# Patient Record
Sex: Female | Born: 1985 | Race: White | Hispanic: No | Marital: Married | Smoking: Current every day smoker
Health system: Southern US, Community
[De-identification: ages and names within clinical notes are randomized; demographics above are authoritative.]

## PROBLEM LIST (undated history)

## (undated) DIAGNOSIS — Z5189 Encounter for other specified aftercare: Secondary | ICD-10-CM

## (undated) DIAGNOSIS — L409 Psoriasis, unspecified: Secondary | ICD-10-CM

## (undated) DIAGNOSIS — T7840XA Allergy, unspecified, initial encounter: Secondary | ICD-10-CM

## (undated) DIAGNOSIS — R569 Unspecified convulsions: Secondary | ICD-10-CM

## (undated) DIAGNOSIS — G930 Cerebral cysts: Secondary | ICD-10-CM

## (undated) DIAGNOSIS — R51 Headache: Secondary | ICD-10-CM

## (undated) DIAGNOSIS — I499 Cardiac arrhythmia, unspecified: Secondary | ICD-10-CM

## (undated) HISTORY — DX: Cardiac arrhythmia, unspecified: I49.9

## (undated) HISTORY — DX: Encounter for other specified aftercare: Z51.89

## (undated) HISTORY — DX: Headache: R51

## (undated) HISTORY — DX: Cerebral cysts: G93.0

## (undated) HISTORY — DX: Unspecified convulsions: R56.9

## (undated) HISTORY — DX: Psoriasis, unspecified: L40.9

## (undated) HISTORY — DX: Allergy, unspecified, initial encounter: T78.40XA

---
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
## 2007-02-25 DIAGNOSIS — Z9189 Other specified personal risk factors, not elsewhere classified: Secondary | ICD-10-CM | POA: Insufficient documentation

## 2007-02-25 DIAGNOSIS — R569 Unspecified convulsions: Secondary | ICD-10-CM

## 2007-02-25 DIAGNOSIS — R55 Syncope and collapse: Secondary | ICD-10-CM | POA: Insufficient documentation

## 2007-03-31 ENCOUNTER — Emergency Department (HOSPITAL_COMMUNITY): Admission: EM | Admit: 2007-03-31 | Discharge: 2007-03-31 | Payer: Self-pay | Admitting: Emergency Medicine

## 2007-09-30 ENCOUNTER — Encounter: Admission: RE | Admit: 2007-09-30 | Discharge: 2007-09-30 | Payer: Self-pay | Admitting: Orthopaedic Surgery

## 2007-10-14 ENCOUNTER — Emergency Department (HOSPITAL_COMMUNITY): Admission: EM | Admit: 2007-10-14 | Discharge: 2007-10-14 | Payer: Self-pay | Admitting: Emergency Medicine

## 2008-03-18 ENCOUNTER — Emergency Department (HOSPITAL_COMMUNITY): Admission: EM | Admit: 2008-03-18 | Discharge: 2008-03-18 | Payer: Self-pay | Admitting: Family Medicine

## 2008-05-04 ENCOUNTER — Inpatient Hospital Stay (HOSPITAL_COMMUNITY): Admission: AD | Admit: 2008-05-04 | Discharge: 2008-05-04 | Payer: Self-pay | Admitting: Obstetrics and Gynecology

## 2008-07-21 ENCOUNTER — Inpatient Hospital Stay (HOSPITAL_COMMUNITY): Admission: AD | Admit: 2008-07-21 | Discharge: 2008-07-23 | Payer: Self-pay | Admitting: Obstetrics and Gynecology

## 2009-01-11 ENCOUNTER — Emergency Department (HOSPITAL_COMMUNITY): Admission: EM | Admit: 2009-01-11 | Discharge: 2009-01-11 | Payer: Self-pay | Admitting: Emergency Medicine

## 2009-03-07 ENCOUNTER — Emergency Department (HOSPITAL_COMMUNITY): Admission: EM | Admit: 2009-03-07 | Discharge: 2009-03-07 | Payer: Self-pay | Admitting: Emergency Medicine

## 2009-05-16 ENCOUNTER — Encounter: Admission: RE | Admit: 2009-05-16 | Discharge: 2009-05-30 | Payer: Self-pay | Admitting: Orthopaedic Surgery

## 2010-09-21 LAB — POCT URINALYSIS DIP (DEVICE)
Glucose, UA: NEGATIVE mg/dL
Ketones, ur: NEGATIVE mg/dL
Protein, ur: NEGATIVE mg/dL
Specific Gravity, Urine: 1.01 (ref 1.005–1.030)
Urobilinogen, UA: 0.2 mg/dL (ref 0.0–1.0)

## 2010-09-21 LAB — POCT PREGNANCY, URINE: Preg Test, Ur: NEGATIVE

## 2010-09-24 ENCOUNTER — Inpatient Hospital Stay (INDEPENDENT_AMBULATORY_CARE_PROVIDER_SITE_OTHER)
Admission: RE | Admit: 2010-09-24 | Discharge: 2010-09-24 | Disposition: A | Discharge status: Home / Self Care | Payer: Self-pay | Source: Ambulatory Visit | Attending: Family Medicine | Admitting: Family Medicine

## 2010-09-24 DIAGNOSIS — N390 Urinary tract infection, site not specified: Secondary | ICD-10-CM

## 2010-09-24 LAB — POCT URINALYSIS DIP (DEVICE)
Glucose, UA: NEGATIVE mg/dL
Protein, ur: 100 mg/dL — AB
Specific Gravity, Urine: 1.02 (ref 1.005–1.030)
Urobilinogen, UA: 0.2 mg/dL (ref 0.0–1.0)

## 2010-09-24 LAB — POCT PREGNANCY, URINE: Preg Test, Ur: NEGATIVE

## 2010-10-02 LAB — URINALYSIS, DIPSTICK ONLY
Bilirubin Urine: NEGATIVE
Glucose, UA: NEGATIVE mg/dL
Hgb urine dipstick: NEGATIVE
Ketones, ur: NEGATIVE mg/dL
Protein, ur: NEGATIVE mg/dL
Urobilinogen, UA: 0.2 mg/dL (ref 0.0–1.0)

## 2010-10-02 LAB — COMPREHENSIVE METABOLIC PANEL
ALT: 12 U/L (ref 0–35)
AST: 19 U/L (ref 0–37)
Albumin: 2.7 g/dL — ABNORMAL LOW (ref 3.5–5.2)
Alkaline Phosphatase: 271 U/L — ABNORMAL HIGH (ref 39–117)
Calcium: 10.2 mg/dL (ref 8.4–10.5)
GFR calc Af Amer: >60 mL/min (ref >60)
Potassium: 3.6 mEq/L (ref 3.5–5.1)
Sodium: 136 mEq/L (ref 135–145)
Total Protein: 6.1 g/dL (ref 6.0–8.3)

## 2010-10-02 LAB — TYPE AND SCREEN

## 2010-10-02 LAB — URIC ACID: Uric Acid, Serum: 7.3 mg/dL — ABNORMAL HIGH (ref 2.4–7.0)

## 2010-10-02 LAB — CBC
HCT: 16.7 % — ABNORMAL LOW (ref 36.0–46.0)
HCT: 21.8 % — ABNORMAL LOW (ref 36.0–46.0)
HCT: 29.6 % — ABNORMAL LOW (ref 36.0–46.0)
Hemoglobin: 7.4 g/dL — CL (ref 12.0–15.0)
MCHC: 35.3 g/dL (ref 30.0–36.0)
MCV: 86.9 fL (ref 78.0–100.0)
MCV: 87.1 fL (ref 78.0–100.0)
Platelets: 130 10*3/uL — ABNORMAL LOW (ref 150–400)
Platelets: 168 10*3/uL (ref 150–400)
RDW: 13.8 % (ref 11.5–15.5)
RDW: 14.5 % (ref 11.5–15.5)
WBC: 10.9 10*3/uL — ABNORMAL HIGH (ref 4.0–10.5)
WBC: 16.1 10*3/uL — ABNORMAL HIGH (ref 4.0–10.5)
WBC: 17.9 10*3/uL — ABNORMAL HIGH (ref 4.0–10.5)

## 2010-10-02 LAB — RPR: RPR Ser Ql: NONREACTIVE

## 2010-10-02 LAB — PREPARE RBC (CROSSMATCH)

## 2010-10-30 NOTE — Discharge Summary (Signed)
NAMEJADWIGA, FAIDLEY                 ACCOUNT NO.:  [id number]   MEDICAL RECORD NO.:  [id number]          PATIENT TYPE:  INP   LOCATION:  9145                          FACILITY:  [facility]   PHYSICIAN:  Zenaida Niece, M.D.DATE OF BIRTH:  1986/01/19   DATE OF ADMISSION:  07/21/2008  DATE OF DISCHARGE:  07/23/2008                               DISCHARGE SUMMARY   ADMISSION DIAGNOSIS:  Intrauterine pregnancy at 39 weeks.   DISCHARGE DIAGNOSES:  1. Intrauterine pregnancy at 39 weeks.  2. Postpartum hemorrhage.   PROCEDURES:  1. On July 21, 2008, she had a spontaneous vaginal delivery  2. On July 22, 2008, she was transfused 2 units of packed red blood      cells.   HISTORY AND PHYSICAL:  This is a 25 year old. gravida 2, para 0-0-1-0  with an EGA of [redacted] weeks, who is admitted by Dr. Senaida Ores for induction  with a favorable cervix.  Prenatal care was complicated by  pyelonephritis at 28 weeks which resolved with outpatient IV and p.o.  antibiotics and then she was put on suppression with Macrobid.  She  smoked about half-pack of cigarettes a day and is positive group B  strep.   PRENATAL LABORATORIES:  Blood type is AB positive with negative antibody  screen, RPR nonreactive, rubella immune, hepatitis B surface antigen  negative, HIV negative, gonorrhea and chlamydia negative, 1-hour Glucola  is normal, and first trimester screen is normal.  Group B strep is  positive in her urine.   PAST OB HISTORY:  One elective termination.   PAST MEDICAL HISTORY:  Remote history of seizures for which she is not  currently on medications.   PHYSICAL EXAMINATION:  VITAL SIGNS:  She is afebrile with stable vital  signs.  ABDOMEN:  Gravid, nontender.  PELVIC:  Cervix is 3+, 70, -2 vertex presentation and membranes were  ruptured revealing clear fluid.   HOSPITAL COURSE:  The patient was admitted and had membranes ruptured by  Dr. Senaida Ores.  She was put on penicillin for group B strep  prophylaxis.  She progressed into labor and received an epidural.  Blood  pressures were slightly elevated, but with normal labs.  She progressed  to complete, pushed well, and on the afternoon of July 21, 2008, had  a vaginal delivery of a viable female infant with Apgars of 8 and 9,  weighed 9 pounds 3 ounces.  Placenta delivered spontaneous.  She had a  second-degree laceration repaired with 2-0 Vicryl.  Estimated blood loss  was 450 mL.  However, after delivery, she had some excess bleeding.  Dr.  Senaida Ores was able to evacuate a large clot from the lower uterine  segment of approximately 250 mL.  She has had total blood loss from  delivery and postpartum approximately 700 mL.  She had Cytotec 400 mcg  placed into the uterine cavity by Dr. Senaida Ores.  She remained stable  that evening.  Predelivery hemoglobin was 9.9.  She did well overnight  on postpartum #1 with some lightheadedness and weakness.  Hemoglobin on  postpartum day #1 was down to  5.9.  Dr. Senaida Ores discussed risks and  benefits of transfusion and she initially declined.  However, later in  the day, she was not able to ambulate effectively and did consent to  transfusion of 2 units of packed red blood cells.  This was done over  the evening of July 22, 2008.  On the morning of July 23, 2008,  postpartum day #2, she was feeling better.  Hemoglobin was up to 7.4  after the transfusion.  At this point, she was felt to be stable enough  for discharge home.   DISCHARGE INSTRUCTIONS:  Regular diet.  Pelvic rest.  Follow up in 6  weeks.   MEDICATIONS:  1. Percocet #20, 1-2 p.o. q.4-6 h. p.r.n. pain.  2. Over-the-counter ibuprofen as needed.  3. Over-the-counter iron b.i.d.  4. She was given our discharge pamphlet.      Zenaida Niece, M.D.  Electronically Signed     TDM/MEDQ  D:  07/23/2008  T:  07/23/2008  Job:  [id number]

## 2011-03-05 ENCOUNTER — Inpatient Hospital Stay (INDEPENDENT_AMBULATORY_CARE_PROVIDER_SITE_OTHER)
Admission: RE | Admit: 2011-03-05 | Discharge: 2011-03-05 | Disposition: A | Discharge status: Home / Self Care | Payer: BC Managed Care – PPO | Source: Ambulatory Visit | Attending: Family Medicine | Admitting: Family Medicine

## 2011-03-05 DIAGNOSIS — J019 Acute sinusitis, unspecified: Secondary | ICD-10-CM

## 2011-03-12 LAB — POCT URINALYSIS DIP (DEVICE)
Glucose, UA: NEGATIVE
Operator id: 247071
Specific Gravity, Urine: 1.025

## 2011-03-20 LAB — BASIC METABOLIC PANEL
CO2: 22
Calcium: 8.2 — ABNORMAL LOW
Creatinine, Ser: 0.58
GFR calc Af Amer: >60

## 2011-03-20 LAB — URINE MICROSCOPIC-ADD ON

## 2011-03-20 LAB — URINALYSIS, ROUTINE W REFLEX MICROSCOPIC
Glucose, UA: NEGATIVE
Specific Gravity, Urine: <1.005 — ABNORMAL LOW
pH: 6

## 2011-03-20 LAB — CBC
MCHC: 35.3
RBC: 2.93 — ABNORMAL LOW
WBC: 17.5 — ABNORMAL HIGH

## 2011-03-28 LAB — URINE CULTURE

## 2011-03-28 LAB — POCT PREGNANCY, URINE
Operator id: 116391
Preg Test, Ur: NEGATIVE

## 2011-03-28 LAB — URINALYSIS, MICROSCOPIC ONLY
Bilirubin Urine: NEGATIVE
Ketones, ur: NEGATIVE
Nitrite: NEGATIVE
Specific Gravity, Urine: 1.012
Urobilinogen, UA: 0.2

## 2011-03-28 LAB — POCT URINALYSIS DIP (DEVICE)
Glucose, UA: NEGATIVE
Operator id: 116391
Specific Gravity, Urine: 1.015
Urobilinogen, UA: 0.2

## 2011-04-25 ENCOUNTER — Other Ambulatory Visit: Payer: Self-pay | Admitting: Family Medicine

## 2011-04-26 ENCOUNTER — Ambulatory Visit
Admission: RE | Admit: 2011-04-26 | Discharge: 2011-04-26 | Disposition: A | Discharge status: Home / Self Care | Payer: BC Managed Care – PPO | Source: Ambulatory Visit | Attending: Family Medicine | Admitting: Family Medicine

## 2011-06-07 LAB — OB RESULTS CONSOLE RUBELLA ANTIBODY, IGM: Rubella: IMMUNE

## 2011-06-07 LAB — OB RESULTS CONSOLE ABO/RH

## 2011-06-07 LAB — OB RESULTS CONSOLE GC/CHLAMYDIA: Gonorrhea: NEGATIVE

## 2011-06-18 NOTE — L&D Delivery Note (Signed)
Delivery Note At 5:06 PM a viable female was delivered via Vaginal, Spontaneous Delivery (Presentation: ; Occiput Anterior).  APGAR: 8, 9; weight pending .   Placenta status: Intact, Spontaneous.  Cord: 3 vessels with the following complications: Nuchal x 1  Anesthesia: Epidural  Episiotomy: None Lacerations: 2nd degree Suture Repair: 3.0 vicryl rapide Est. Blood Loss (mL): 400  Mom to postpartum.  Baby to nursery-stable.  Oliver Pila 01/02/2012, 5:31 PM

## 2011-11-21 ENCOUNTER — Emergency Department (HOSPITAL_COMMUNITY)
Admission: EM | Admit: 2011-11-21 | Discharge: 2011-11-21 | Disposition: A | Discharge status: Home / Self Care | Payer: No Typology Code available for payment source | Attending: Emergency Medicine | Admitting: Emergency Medicine

## 2011-11-21 ENCOUNTER — Encounter (HOSPITAL_COMMUNITY): Payer: Self-pay | Admitting: *Deleted

## 2011-11-21 ENCOUNTER — Emergency Department (HOSPITAL_COMMUNITY): Payer: No Typology Code available for payment source

## 2011-11-21 DIAGNOSIS — Z3490 Encounter for supervision of normal pregnancy, unspecified, unspecified trimester: Secondary | ICD-10-CM

## 2011-11-21 DIAGNOSIS — Y92410 Unspecified street and highway as the place of occurrence of the external cause: Secondary | ICD-10-CM | POA: Insufficient documentation

## 2011-11-21 DIAGNOSIS — S20219A Contusion of unspecified front wall of thorax, initial encounter: Secondary | ICD-10-CM | POA: Insufficient documentation

## 2011-11-21 DIAGNOSIS — O2690 Pregnancy related conditions, unspecified, unspecified trimester: Secondary | ICD-10-CM | POA: Insufficient documentation

## 2011-11-21 LAB — KLEIHAUER-BETKE STAIN: Quantitation Fetal Hemoglobin: 0 mL

## 2011-11-21 LAB — URINALYSIS, ROUTINE W REFLEX MICROSCOPIC
Nitrite: NEGATIVE
Specific Gravity, Urine: 1.011 (ref 1.005–1.030)
pH: 7 (ref 5.0–8.0)

## 2011-11-21 LAB — URINE MICROSCOPIC-ADD ON

## 2011-11-21 NOTE — Progress Notes (Addendum)
EFM applied, fetal movement noted. OBIX tracing not available at this time.
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
## 2011-11-21 NOTE — Progress Notes (Signed)
Pt arrived via wheelchair. Pt assessed. Pt denies leaking fluid, FM felt and palpated. Pt states having low abd discomfort (dull ache) and back pain. MD in to assess pt. Pt has bruise on left clavicle. No other external injuries noted. No abd tenderness with palpation. Abd soft.

## 2011-11-21 NOTE — Progress Notes (Signed)
Discharge instructions given antenatal discharge instruction sheet. Pt verbalized understanding.
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
## 2011-12-24 ENCOUNTER — Telehealth (HOSPITAL_COMMUNITY): Payer: Self-pay | Admitting: *Deleted

## 2011-12-24 ENCOUNTER — Encounter (HOSPITAL_COMMUNITY): Payer: Self-pay | Admitting: *Deleted

## 2011-12-24 NOTE — Telephone Encounter (Signed)
Preadmission screen  

## 2011-12-26 ENCOUNTER — Encounter (HOSPITAL_COMMUNITY): Payer: Self-pay | Admitting: *Deleted

## 2011-12-26 ENCOUNTER — Telehealth (HOSPITAL_COMMUNITY): Payer: Self-pay | Admitting: *Deleted

## 2011-12-26 NOTE — Telephone Encounter (Signed)
Preadmission screen  

## 2012-01-01 NOTE — H&P (Signed)
Lauren [name] is a 26 y.o. femaleG3P1011 at 39+ weeks (EDD 01/05/12 by LMP c/w 8 week [place])  presenting for induction of labor given term status and favorable cervix.  No prenatal issues except GBS positive.  History OB History    Grav Para Term Preterm Abortions TAB SAB Ect Mult Living   3 1 1  0 1 1  0 0 1    2010 NSVD 9#3oz 2008 EAB  Past Medical History  Diagnosis Date  . Headache     migraines in middle school  . Seizures     from age [age], last with migraine  . 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 Psoriasis     age 83   Past Surgical History  Procedure Date  . Tonsillectomy   . Wisdom tooth extraction    Family History: family history includes Cancer in her maternal grandmother; Diabetes in her maternal grandmother; Heart disease in her maternal grandfather and maternal grandmother; and Hypertension in her maternal grandmother. Social History:  reports that she has been smoking Cigarettes.  She has a 3.5 pack-year smoking history. She has never used smokeless tobacco. She reports that she does not drink alcohol or use illicit drugs.   Prenatal Transfer Tool  Maternal Diabetes: No Genetic Screening: Normal Maternal Ultrasounds/Referrals: Normal Fetal Ultrasounds or other Referrals:  None Maternal Substance Abuse:  No Significant Maternal Medications:  None Significant Maternal Lab Results:  Lab values include: Group B Strep positive Other Comments:  None  ROS    There were no vitals taken for this visit. Maternal Exam:  Uterine Assessment: Contraction strength is mild.  Contraction frequency is irregular.   Abdomen: Fetal presentation: vertex  Introitus: Normal vulva. Normal vagina.    Physical Exam  Constitutional: She is oriented to person, place, and time. She appears well-developed and well-nourished.  Cardiovascular: Normal rate and regular rhythm.   Respiratory: Effort normal.  GI: Soft.  Genitourinary: Vagina normal and uterus normal.  Neurological: She  is alert and oriented to person, place, and time.  Psychiatric: She has a normal mood and affect. Her behavior is normal.    Prenatal labs: ABO, Rh: AB/Positive/-- (12/21 0000) Antibody:  negative Rubella: Immune (12/21 0000) RPR: Nonreactive (12/21 0000)  HBsAg: Negative (12/21 0000)  HIV: Non-reactive (12/21 0000)  GBS: Positive (06/26 0000)  One hour GTT 99 First trimester screen and AFP WNL   Assessment/Plan: Pt will start PCN for +GBS and Pitocin induction.  AROM after antibiotics on board. Pt has expressed interest in BTL. I have discouraged this due to her young age.  She is noe considering a mirena.   Oliver Pila 01/01/2012, 5:47 PM

## 2012-01-02 ENCOUNTER — Encounter (HOSPITAL_COMMUNITY): Payer: Self-pay

## 2012-01-02 ENCOUNTER — Inpatient Hospital Stay (HOSPITAL_COMMUNITY): Payer: BC Managed Care – PPO | Admitting: Anesthesiology

## 2012-01-02 ENCOUNTER — Inpatient Hospital Stay (HOSPITAL_COMMUNITY)
Admission: RE | Admit: 2012-01-02 | Discharge: 2012-01-04 | DRG: 373 | Disposition: A | Discharge status: Home / Self Care | Payer: BC Managed Care – PPO | Source: Ambulatory Visit | Attending: Obstetrics and Gynecology | Admitting: Obstetrics and Gynecology

## 2012-01-02 ENCOUNTER — Encounter (HOSPITAL_COMMUNITY): Payer: Self-pay | Admitting: Anesthesiology

## 2012-01-02 DIAGNOSIS — Z22330 Carrier of Group B streptococcus: Secondary | ICD-10-CM

## 2012-01-02 DIAGNOSIS — O99892 Other specified diseases and conditions complicating childbirth: Secondary | ICD-10-CM | POA: Diagnosis present

## 2012-01-02 LAB — CBC
HCT: 27.7 % — ABNORMAL LOW (ref 36.0–46.0)
MCH: 28.8 pg (ref 26.0–34.0)
MCHC: 33.2 g/dL (ref 30.0–36.0)
MCV: 86.6 fL (ref 78.0–100.0)
RDW: 14.1 % (ref 11.5–15.5)

## 2012-01-02 MED ORDER — IBUPROFEN 600 MG PO TABS
600.0000 mg | ORAL_TABLET | Freq: Four times a day (QID) | ORAL | Status: DC
  Administered 2012-01-03 – 2012-01-04 (×6): 600 mg via ORAL
  Filled 2012-01-02 (×6): qty 1

## 2012-01-02 MED ORDER — WITCH HAZEL-GLYCERIN EX PADS: 1.0000 "application " | MEDICATED_PAD | CUTANEOUS | Status: DC | PRN

## 2012-01-02 MED ORDER — EPHEDRINE 5 MG/ML INJ: 10.0000 mg | INTRAVENOUS | Status: DC | PRN

## 2012-01-02 MED ORDER — LACTATED RINGERS IV SOLN: 500.0000 mL | INTRAVENOUS | Status: DC | PRN

## 2012-01-02 MED ORDER — LACTATED RINGERS IV SOLN
INTRAVENOUS | Status: DC
  Administered 2012-01-02 (×2): via INTRAVENOUS

## 2012-01-02 MED ORDER — OXYCODONE-ACETAMINOPHEN 5-325 MG PO TABS: 1.0000 | ORAL_TABLET | ORAL | Status: DC | PRN

## 2012-01-02 MED ORDER — LACTATED RINGERS IV SOLN
500.0000 mL | Freq: Once | INTRAVENOUS | Status: AC
  Administered 2012-01-02: 500 mL via INTRAVENOUS

## 2012-01-02 MED ORDER — SENNOSIDES-DOCUSATE SODIUM 8.6-50 MG PO TABS
2.0000 | ORAL_TABLET | Freq: Every day | ORAL | Status: DC
  Administered 2012-01-02 – 2012-01-03 (×2): 2 via ORAL

## 2012-01-02 MED ORDER — TETANUS-DIPHTH-ACELL PERTUSSIS 5-2.5-18.5 LF-MCG/0.5 IM SUSP
0.5000 mL | Freq: Once | INTRAMUSCULAR | Status: AC
  Administered 2012-01-04: 0.5 mL via INTRAMUSCULAR
  Filled 2012-01-02: qty 0.5

## 2012-01-02 MED ORDER — FLEET ENEMA 7-19 GM/118ML RE ENEM: 1.0000 | ENEMA | RECTAL | Status: DC | PRN

## 2012-01-02 MED ORDER — FENTANYL 2.5 MCG/ML BUPIVACAINE 1/10 % EPIDURAL INFUSION (WH - ANES)
14.0000 mL/h | INTRAMUSCULAR | Status: DC
  Administered 2012-01-02 (×2): 14 mL/h via EPIDURAL
  Filled 2012-01-02 (×2): qty 60

## 2012-01-02 MED ORDER — LIDOCAINE HCL (PF) 1 % IJ SOLN: 30.0000 mL | INTRAMUSCULAR | Status: DC | PRN

## 2012-01-02 MED ORDER — DIPHENHYDRAMINE HCL 25 MG PO CAPS: 25.0000 mg | ORAL_CAPSULE | Freq: Four times a day (QID) | ORAL | Status: DC | PRN

## 2012-01-02 MED ORDER — DEXTROSE 5 % IV SOLN
2.5000 10*6.[IU] | INTRAVENOUS | Status: DC
  Administered 2012-01-02 (×2): 2.5 10*6.[IU] via INTRAVENOUS
  Filled 2012-01-02 (×5): qty 2.5

## 2012-01-02 MED ORDER — OXYTOCIN 40 UNITS IN LACTATED RINGERS INFUSION - SIMPLE MED: 62.5000 mL/h | Freq: Once | INTRAVENOUS | Status: DC

## 2012-01-02 MED ORDER — PRENATAL MULTIVITAMIN CH
1.0000 | ORAL_TABLET | Freq: Every day | ORAL | Status: DC
  Administered 2012-01-03 – 2012-01-04 (×2): 1 via ORAL
  Filled 2012-01-02 (×2): qty 1

## 2012-01-02 MED ORDER — ZOLPIDEM TARTRATE 5 MG PO TABS: 5.0000 mg | ORAL_TABLET | Freq: Every evening | ORAL | Status: DC | PRN

## 2012-01-02 MED ORDER — EPHEDRINE 5 MG/ML INJ
10.0000 mg | INTRAVENOUS | Status: DC | PRN
  Filled 2012-01-02: qty 4

## 2012-01-02 MED ORDER — OXYTOCIN 40 UNITS IN LACTATED RINGERS INFUSION - SIMPLE MED
1.0000 m[IU]/min | INTRAVENOUS | Status: DC
  Administered 2012-01-02: 6 m[IU]/min via INTRAVENOUS
  Administered 2012-01-02: 2 m[IU]/min via INTRAVENOUS
  Filled 2012-01-02: qty 1000

## 2012-01-02 MED ORDER — ONDANSETRON HCL 4 MG/2ML IJ SOLN: 4.0000 mg | INTRAMUSCULAR | Status: DC | PRN

## 2012-01-02 MED ORDER — ONDANSETRON HCL 4 MG PO TABS: 4.0000 mg | ORAL_TABLET | ORAL | Status: DC | PRN

## 2012-01-02 MED ORDER — ONDANSETRON HCL 4 MG/2ML IJ SOLN: 4.0000 mg | Freq: Four times a day (QID) | INTRAMUSCULAR | Status: DC | PRN

## 2012-01-02 MED ORDER — PHENYLEPHRINE 40 MCG/ML (10ML) SYRINGE FOR IV PUSH (FOR BLOOD PRESSURE SUPPORT)
80.0000 ug | PREFILLED_SYRINGE | INTRAVENOUS | Status: DC | PRN
  Filled 2012-01-02: qty 5

## 2012-01-02 MED ORDER — IBUPROFEN 600 MG PO TABS
600.0000 mg | ORAL_TABLET | Freq: Four times a day (QID) | ORAL | Status: DC | PRN
  Administered 2012-01-02: 600 mg via ORAL
  Filled 2012-01-02: qty 1

## 2012-01-02 MED ORDER — OXYTOCIN BOLUS FROM INFUSION
250.0000 mL | Freq: Once | INTRAVENOUS | Status: AC
  Administered 2012-01-02: 250 mL via INTRAVENOUS
  Filled 2012-01-02: qty 500

## 2012-01-02 MED ORDER — PHENYLEPHRINE 40 MCG/ML (10ML) SYRINGE FOR IV PUSH (FOR BLOOD PRESSURE SUPPORT): 80.0000 ug | PREFILLED_SYRINGE | INTRAVENOUS | Status: DC | PRN

## 2012-01-02 MED ORDER — LANOLIN HYDROUS EX OINT: TOPICAL_OINTMENT | CUTANEOUS | Status: DC | PRN

## 2012-01-02 MED ORDER — DIBUCAINE 1 % RE OINT: 1.0000 "application " | TOPICAL_OINTMENT | RECTAL | Status: DC | PRN

## 2012-01-02 MED ORDER — OXYCODONE-ACETAMINOPHEN 5-325 MG PO TABS
1.0000 | ORAL_TABLET | ORAL | Status: DC | PRN
Start: 2012-01-02 — End: 2012-01-04

## 2012-01-02 MED ORDER — LIDOCAINE HCL (PF) 1 % IJ SOLN
INTRAMUSCULAR | Status: DC | PRN
  Administered 2012-01-02 (×3): 4 mL

## 2012-01-02 MED ORDER — DIPHENHYDRAMINE HCL 50 MG/ML IJ SOLN: 12.5000 mg | INTRAMUSCULAR | Status: DC | PRN

## 2012-01-02 MED ORDER — TERBUTALINE SULFATE 1 MG/ML IJ SOLN: 0.2500 mg | Freq: Once | INTRAMUSCULAR | Status: DC | PRN

## 2012-01-02 MED ORDER — CITRIC ACID-SODIUM CITRATE 334-500 MG/5ML PO SOLN: 30.0000 mL | ORAL | Status: DC | PRN

## 2012-01-02 MED ORDER — PENICILLIN G POTASSIUM 5000000 UNITS IJ SOLR
5.0000 10*6.[IU] | Freq: Once | INTRAVENOUS | Status: AC
  Administered 2012-01-02: 5 10*6.[IU] via INTRAVENOUS
  Filled 2012-01-02: qty 5

## 2012-01-02 MED ORDER — SIMETHICONE 80 MG PO CHEW
80.0000 mg | CHEWABLE_TABLET | ORAL | Status: DC | PRN
  Administered 2012-01-03: 80 mg via ORAL

## 2012-01-02 MED ORDER — ACETAMINOPHEN 325 MG PO TABS
650.0000 mg | ORAL_TABLET | ORAL | Status: DC | PRN
  Administered 2012-01-02: 650 mg via ORAL
  Filled 2012-01-02: qty 2

## 2012-01-02 MED ORDER — BENZOCAINE-MENTHOL 20-0.5 % EX AERO
1.0000 "application " | INHALATION_SPRAY | CUTANEOUS | Status: DC | PRN
  Administered 2012-01-02: 1 via TOPICAL
  Filled 2012-01-02: qty 56

## 2012-01-02 NOTE — Progress Notes (Signed)
   Subjective: Pt feeling mild cramping only.    Objective: BP 120/72  Pulse 93  Temp 98.1 F (36.7 C)  Resp 20  Ht 5\' 5"  (1.651 m)  Wt 75.297 kg (166 lb)  BMI 27.62 kg/m2      FHT:  FHR: 130 bpm, variability: moderate,  accelerations:  Present,  decelerations:  Absent UC:   Irregular q 5-7 min SVE:   3/60/-2  AROM clear   Labs: Lab Results  Component Value Date   WBC 13.6* 01/02/2012   HGB 9.2* 01/02/2012   HCT 27.7* 01/02/2012   MCV 86.6 01/02/2012   PLT 132* 01/02/2012    Assessment / Plan: PCN onboard for +GBS.  Pitocin begun and increasing.  Lauren [name] 01/02/2012, 8:54 AM

## 2012-01-02 NOTE — Anesthesia Procedure Notes (Signed)
Epidural Patient location during procedure: OB Start time: 01/02/2012 1:44 PM Reason for block: procedure for pain  Staffing Performed by: anesthesiologist   Preanesthetic Checklist Completed: patient identified, site marked, surgical consent, pre-op evaluation, timeout performed, IV checked, risks and benefits discussed and monitors and equipment checked  Epidural Patient position: sitting Prep: site prepped and draped and DuraPrep Patient monitoring: continuous pulse ox and blood pressure Approach: midline Injection technique: LOR air  Needle:  Needle type: Tuohy  Needle gauge: 17 G Needle length: 9 cm Needle insertion depth: 5 cm cm Catheter type: closed end flexible Catheter size: 19 Gauge Catheter at skin depth: 10 cm Test dose: negative  Assessment Events: blood not aspirated, injection not painful, no injection resistance, negative IV test and no paresthesia  Additional Notes Discussed risk of headache, infection, bleeding, nerve injury and failed or incomplete block.  Patient voices understanding and wishes to proceed.
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
## 2012-01-02 NOTE — Progress Notes (Signed)
   Subjective: Pt doing well.  Getting uncomfortable  Objective: BP 126/75  Pulse 88  Temp 96.8 F (36 C) (Oral)  Resp 20  Ht 5\' 5"  (1.651 m)  Wt 75.297 kg (166 lb)  BMI 27.62 kg/m2      FHT:  FHR: 130 bpm, variability: moderate,  accelerations:  Present,  decelerations:  Absent UC:   regular, every 3-4 minutes SVE:   Dilation: 4 Effacement (%): 80 Station: -1 Exam by:: [name]  Labs: Lab Results  Component Value Date   WBC 13.6* 01/02/2012   HGB 9.2* 01/02/2012   HCT 27.7* 01/02/2012   MCV 86.6 01/02/2012   PLT 132* 01/02/2012    Assessment / Plan: Making progress, plans epidural [name] W 01/02/2012, 12:18 PM

## 2012-01-03 LAB — CBC
MCH: 28.9 pg (ref 26.0–34.0)
MCHC: 33.3 g/dL (ref 30.0–36.0)
Platelets: 112 10*3/uL — ABNORMAL LOW (ref 150–400)
RDW: 14.1 % (ref 11.5–15.5)

## 2012-01-03 NOTE — Anesthesia Postprocedure Evaluation (Signed)
  Anesthesia Post-op Note  Patient: Lauren [name]  Procedure(s) Performed: * No procedures listed *  Patient Location: Mother/Baby  Anesthesia Type: Epidural  Level of Consciousness: awake  Airway and Oxygen Therapy: Patient Spontanous Breathing  Post-op Pain: none  Post-op Assessment: Patient's Cardiovascular Status Stable, Respiratory Function Stable, Patent Airway, No signs of Nausea or vomiting, Adequate PO intake, Pain level controlled, No headache, No backache, No residual numbness and No residual motor weakness  Post-op Vital Signs: Reviewed and stable  Complications: No apparent anesthesia complications

## 2012-01-03 NOTE — Progress Notes (Signed)
Post Partum Day 1 Subjective: no complaints, up ad lib and tolerating PO  Objective: Blood pressure 105/66, pulse 87, temperature 98.8 F (37.1 C), temperature source Oral, resp. rate 18, height 5\' 5"  (1.651 m), weight 75.297 kg (166 lb), SpO2 99.00%, unknown if currently breastfeeding.  Physical Exam:  General: alert and cooperative Lochia: appropriate Uterine Fundus: firm    Basename 01/03/12 0600 01/02/12 0755  HGB 7.9* 9.2*  HCT 23.7* 27.7*    Assessment/Plan: Plan for discharge tomorrow   LOS: 1 day   Lauren [name] W 01/03/2012, 8:06 AM

## 2012-01-04 ENCOUNTER — Encounter (HOSPITAL_COMMUNITY): Payer: Self-pay

## 2012-01-04 MED ORDER — IBUPROFEN 800 MG PO TABS: 800.0000 mg | ORAL_TABLET | Freq: Three times a day (TID) | ORAL | Status: AC | PRN

## 2012-01-04 MED ORDER — OXYCODONE-ACETAMINOPHEN 5-325 MG PO TABS: 1.0000 | ORAL_TABLET | Freq: Four times a day (QID) | ORAL | Status: AC | PRN

## 2012-01-04 NOTE — Progress Notes (Signed)
Post Partum Day 2 Subjective: no complaints, up ad lib, tolerating PO and nl lochia, pain controlled  Objective: Blood pressure 104/66, pulse 88, temperature 98.3 F (36.8 C), temperature source Oral, resp. rate 18, height 5\' 5"  (1.651 m), weight 75.297 kg (166 lb), SpO2 98.00%, unknown if currently breastfeeding.  Physical Exam:  General: alert and no distress Lochia: appropriate Uterine Fundus: firm   Basename 01/03/12 0600 01/02/12 0755  HGB 7.9* 9.2*  HCT 23.7* 27.7*    Assessment/Plan: Discharge home, f/u 6 weeks, d/c with motrin, percocet, pnv   LOS: 2 days   Lauren [name] 01/04/2012, 10:27 AM

## 2012-01-04 NOTE — Discharge Summary (Signed)
Obstetric Discharge Summary Reason for Admission: induction of labor Prenatal Procedures: none Intrapartum Procedures: spontaneous vaginal delivery Postpartum Procedures: none Complications-Operative and Postpartum: 2nd degree perineal laceration Hemoglobin  Date Value Range Status  01/03/2012 7.9* 12.0 - 15.0 g/dL Final     HCT  Date Value Range Status  01/03/2012 23.7* 36.0 - 46.0 % Final    Physical Exam:  General: alert and no distress Lochia: appropriate Uterine Fundus: firm   Discharge Diagnoses: Term Pregnancy-delivered  Discharge Information: Date: 01/04/2012 Activity: pelvic rest Diet: routine Medications: PNV, Ibuprofen and Percocet Condition: stable Instructions: refer to practice specific booklet Discharge to: home Follow-up Information    Follow up with Oliver Pila, MD. Schedule an appointment as soon as possible for a visit in 6 weeks.   Contact information:   [address] [place] [address] [phone]          Newborn Data: Live born female  Birth Weight: 8 lb 11.9 oz (3965 g) APGAR: 8, 9  Home with mother.  BOVARD,Lachlan Mckim 01/04/2012, 10:32 AM

## 2012-01-05 ENCOUNTER — Inpatient Hospital Stay (HOSPITAL_COMMUNITY): Admission: AD | Admit: 2012-01-05 | Payer: Self-pay | Source: Ambulatory Visit | Admitting: Obstetrics and Gynecology

## 2012-01-05 LAB — TYPE AND SCREEN: Unit division: 0

## 2012-03-02 IMAGING — US US ABDOMEN COMPLETE
1 series · 14 of 25 positions shown · non-contrast
Comparison: None.

CLINICAL DATA: Abdominal pain.  Question mass or hernia.

COMPLETE ABDOMINAL ULTRASOUND

[Series 1: us abdomen complete · 14 of 87 slices shown]
[im 1/87]
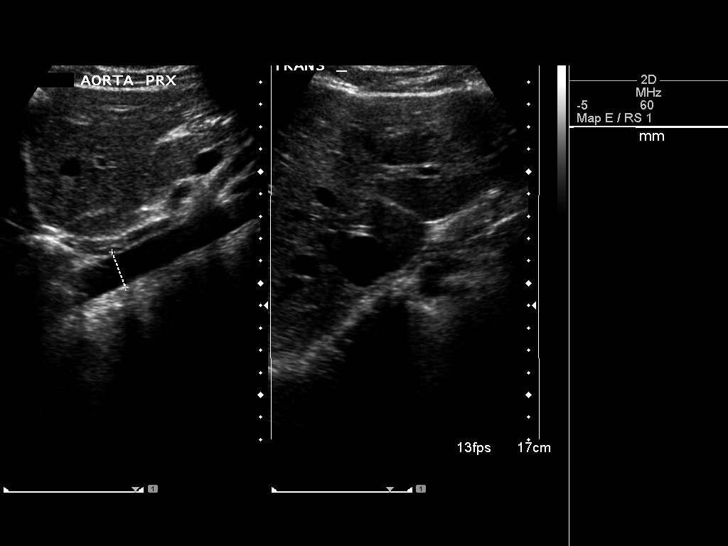
[im 8/87]
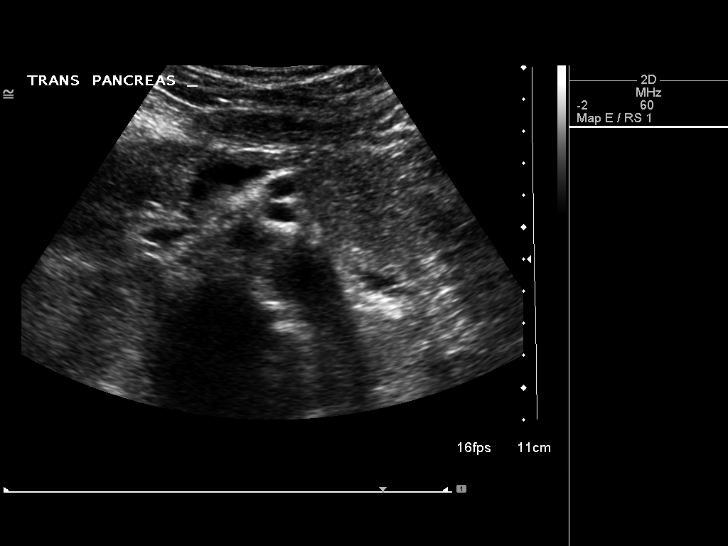
[im 15/87]
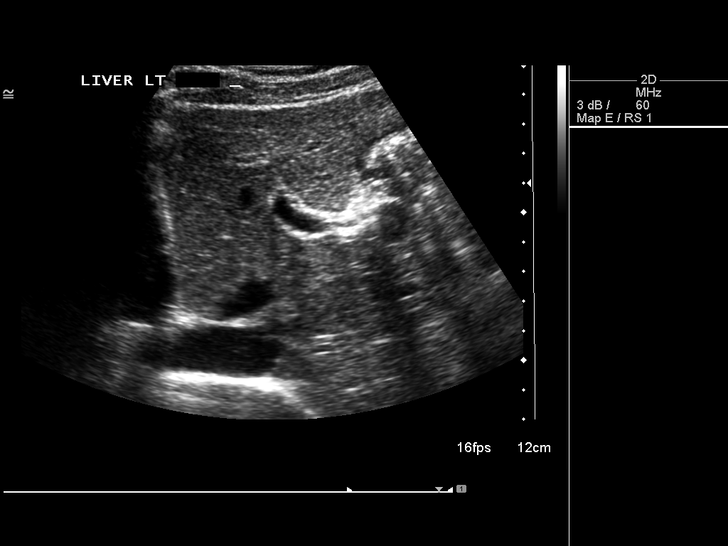
[im 22/87]
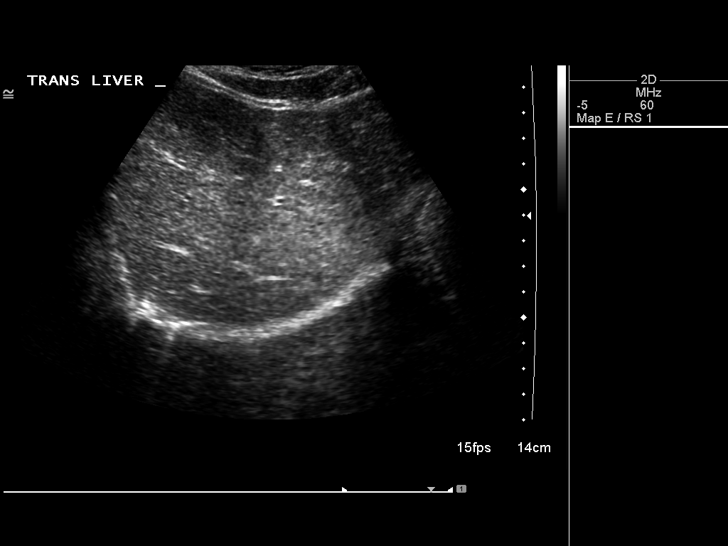
[im 29/87]
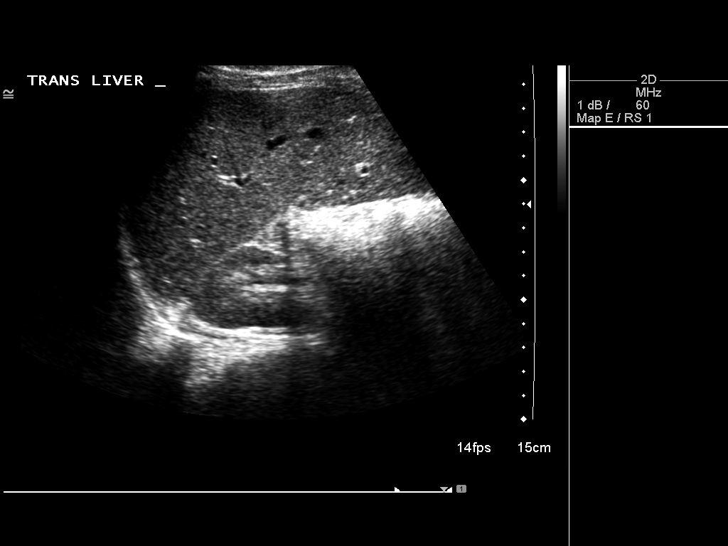
[im 33/87]
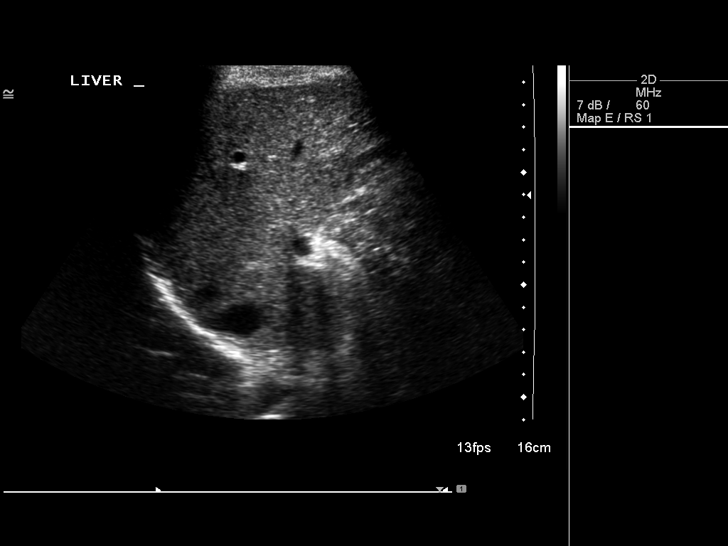
[im 40/87]
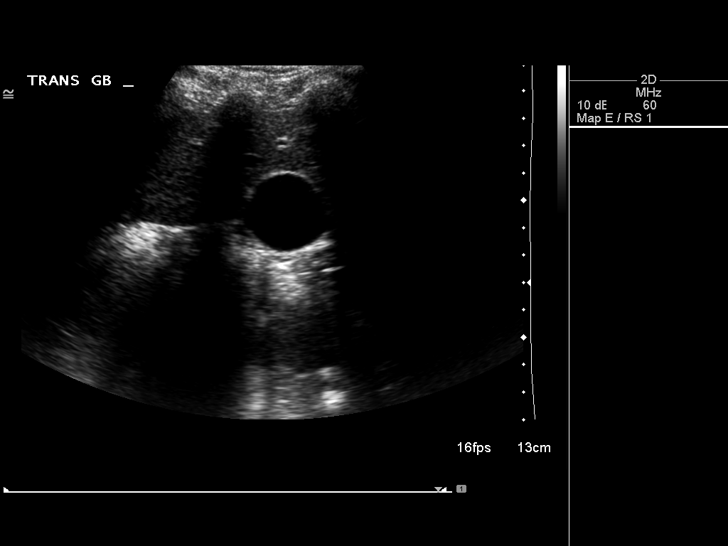
[im 47/87]
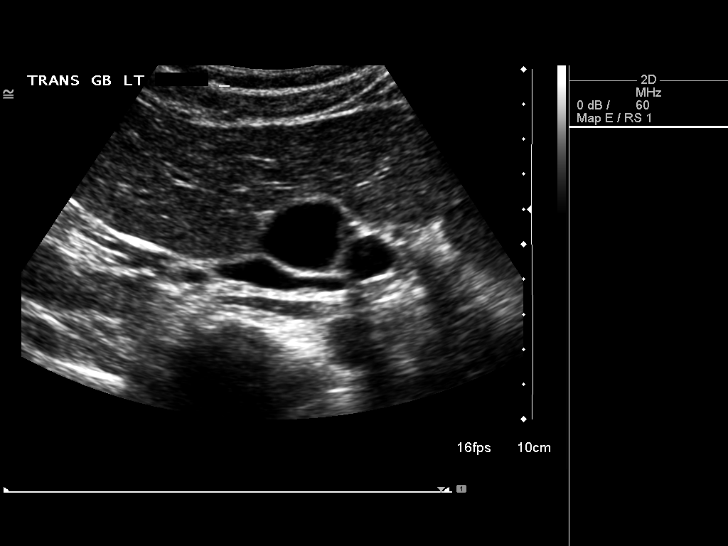
[im 54/87]
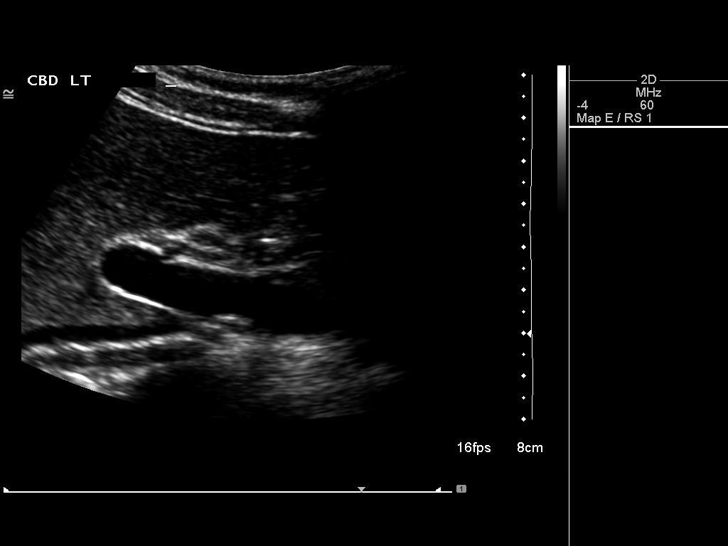
[im 58/87]
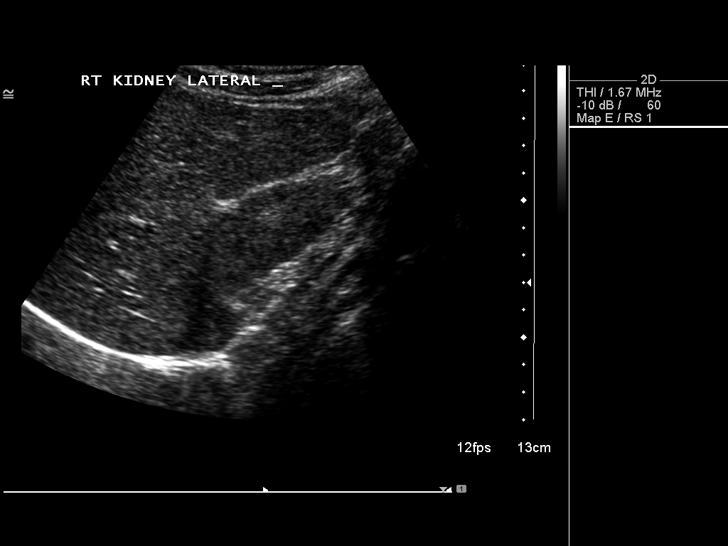
[im 65/87]
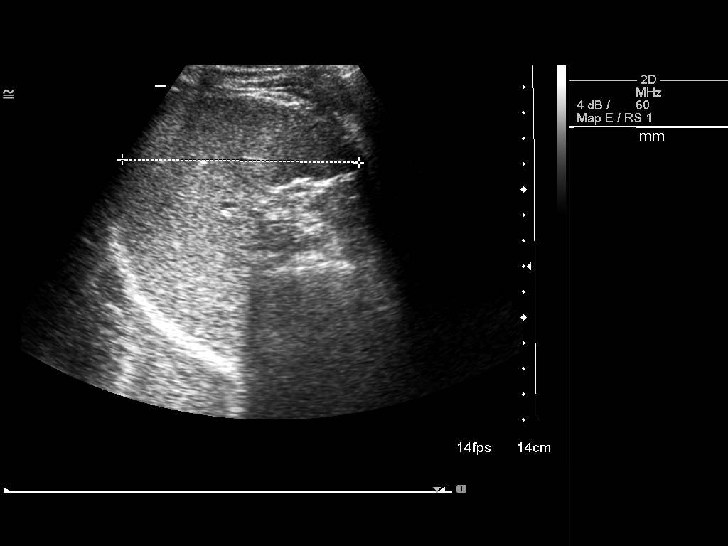
[im 72/87]
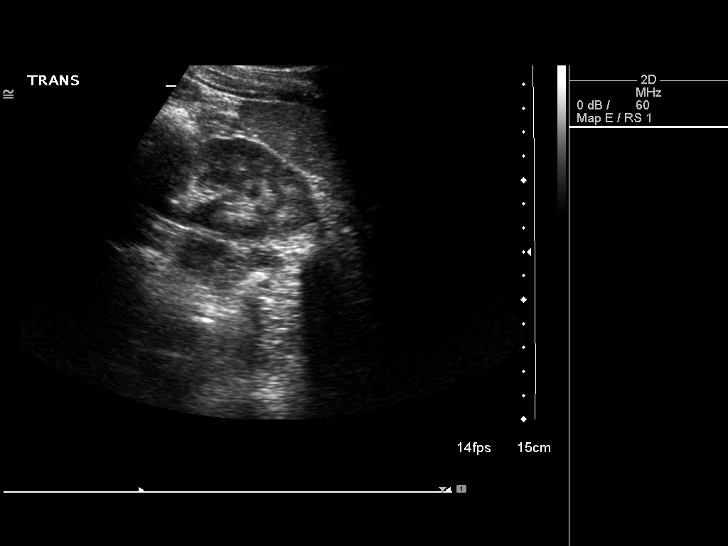
[im 79/87]
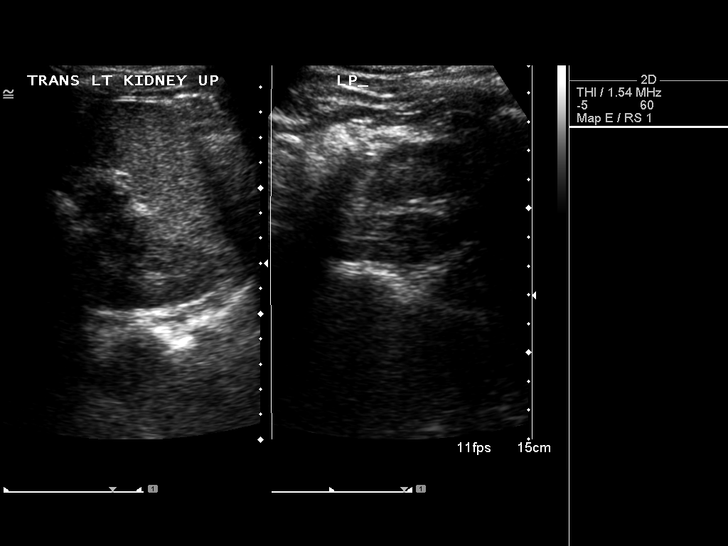
[im 87/87]
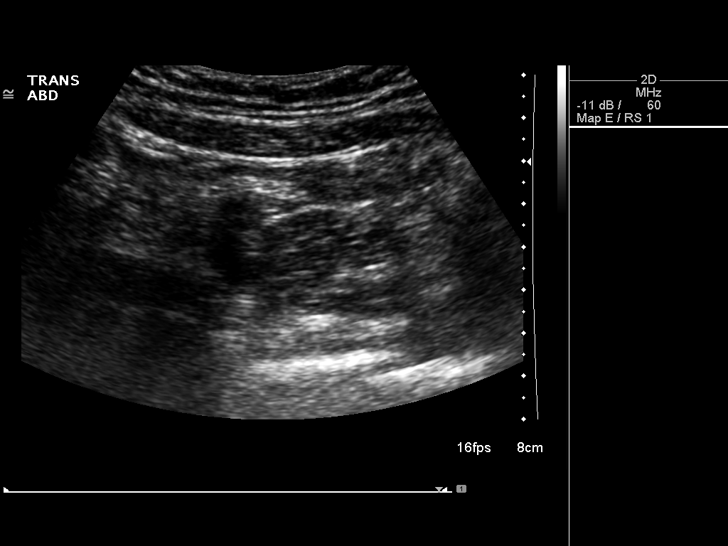

[14 of 25 positions shown; findings below may reference images not displayed]

FINDINGS: Gallbladder:  No gallstones, gallbladder wall thickening, or
pericholecystic fluid.

Common bile duct:   Within normal limits in caliber.

Liver:  No focal lesion identified.  Within normal limits in
parenchymal echogenicity.

IVC:  Appears normal.

Pancreas:  No focal abnormality seen.

Spleen:  Within normal limits in size and echotexture.

Right Kidney:   Normal in size and parenchymal echogenicity.  No
evidence of mass or hydronephrosis.

Left Kidney:  Normal in size and parenchymal echogenicity.  No
evidence of mass or hydronephrosis.

Abdominal aorta:  No aneurysm identified.

No abnormality seen in the mid abdominal wall in the area of
patient's pain.
IMPRESSION: Negative abdominal ultrasound.
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
## 2013-03-05 ENCOUNTER — Emergency Department (HOSPITAL_COMMUNITY): Payer: BC Managed Care – PPO

## 2013-03-05 ENCOUNTER — Emergency Department (HOSPITAL_COMMUNITY)
Admission: EM | Admit: 2013-03-05 | Discharge: 2013-03-05 | Disposition: A | Discharge status: Home / Self Care | Payer: BC Managed Care – PPO | Attending: Emergency Medicine | Admitting: Emergency Medicine

## 2013-03-05 ENCOUNTER — Encounter (HOSPITAL_COMMUNITY): Payer: Self-pay | Admitting: Emergency Medicine

## 2013-03-05 DIAGNOSIS — R1110 Vomiting, unspecified: Secondary | ICD-10-CM | POA: Insufficient documentation

## 2013-03-05 DIAGNOSIS — Z872 Personal history of diseases of the skin and subcutaneous tissue: Secondary | ICD-10-CM | POA: Insufficient documentation

## 2013-03-05 DIAGNOSIS — R0609 Other forms of dyspnea: Secondary | ICD-10-CM | POA: Insufficient documentation

## 2013-03-05 DIAGNOSIS — Z3202 Encounter for pregnancy test, result negative: Secondary | ICD-10-CM | POA: Insufficient documentation

## 2013-03-05 DIAGNOSIS — F411 Generalized anxiety disorder: Secondary | ICD-10-CM | POA: Insufficient documentation

## 2013-03-05 DIAGNOSIS — Z8669 Personal history of other diseases of the nervous system and sense organs: Secondary | ICD-10-CM | POA: Insufficient documentation

## 2013-03-05 DIAGNOSIS — R0600 Dyspnea, unspecified: Secondary | ICD-10-CM

## 2013-03-05 DIAGNOSIS — R002 Palpitations: Secondary | ICD-10-CM | POA: Insufficient documentation

## 2013-03-05 DIAGNOSIS — F17200 Nicotine dependence, unspecified, uncomplicated: Secondary | ICD-10-CM | POA: Insufficient documentation

## 2013-03-05 DIAGNOSIS — R0989 Other specified symptoms and signs involving the circulatory and respiratory systems: Secondary | ICD-10-CM | POA: Insufficient documentation

## 2013-03-05 DIAGNOSIS — Z79899 Other long term (current) drug therapy: Secondary | ICD-10-CM | POA: Insufficient documentation

## 2013-03-05 DIAGNOSIS — R072 Precordial pain: Secondary | ICD-10-CM | POA: Insufficient documentation

## 2013-03-05 DIAGNOSIS — R197 Diarrhea, unspecified: Secondary | ICD-10-CM | POA: Insufficient documentation

## 2013-03-05 LAB — POCT I-STAT, CHEM 8
BUN: 12 mg/dL (ref 6–23)
Calcium, Ion: 1.11 mmol/L — ABNORMAL LOW (ref 1.12–1.23)
Creatinine, Ser: 0.9 mg/dL (ref 0.50–1.10)
Glucose, Bld: 99 mg/dL (ref 70–99)
HCT: 41 % (ref 36.0–46.0)
Hemoglobin: 13.9 g/dL (ref 12.0–15.0)
Sodium: 139 mEq/L (ref 135–145)
TCO2: 20 mmol/L (ref 0–100)

## 2013-03-05 LAB — POCT I-STAT TROPONIN I: Troponin i, poc: 0 ng/mL (ref 0.00–0.08)

## 2013-03-05 LAB — D-DIMER, QUANTITATIVE: D-Dimer, Quant: <0.27 ug/mL-FEU (ref 0.00–0.48)

## 2013-03-05 NOTE — ED Notes (Signed)
Per EMS: Pt started having SOB and tachypnea 3 hrs ago. Resolved with slow, deep breathing. Tingling down both arms. No other complaints.

## 2013-03-05 NOTE — ED Notes (Signed)
Bed: WA03 Expected date:  Expected time:  Means of arrival:  Comments: EMS/27 yo with SOB-recent URI diagnosis

## 2013-03-05 NOTE — ED Notes (Signed)
EKG given to EDP, Fonnie Jarvis, MD.

## 2013-03-05 NOTE — ED Provider Notes (Signed)
CSN: [id number]     Arrival date & time 03/05/13  1937 History   First MD Initiated Contact with Patient 03/05/13 1941     Chief Complaint  Patient presents with  . Shortness of Breath   (Consider location/radiation/quality/duration/timing/severity/associated sxs/prior Treatment) HPI This 27 year old female has 3 days of a constant mild shortness of breath syndrome worsening the last several hours to become moderate, she is no fever or cough, she has had a vague mild chest ache all day today in the left sternal region nonradiating, she has no pleuritic pain or exertional pain, her pain is positional and worse with palpation, she has no rash or trauma, she is no abdominal pain, 2 days ago she had several episodes of nonbloody vomiting and diarrhea that resolved, she has no focal weakness or numbness, she is no recent travel or immobilization, she has no history of congenital heart disease or blood clots in her legs or lungs, she is no treatment prior to arrival, she denies any threats to harm herself or others, she does drink several Red Bull energy drinks per day. She saw her doctor today for this who felt she had a viral syndrome. Past Medical History  Diagnosis Date  . Headache(784.0)     migraines in middle school  . Seizures     from age [age], last with migraine  . 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 Psoriasis     age 89  . SVD (spontaneous vaginal delivery) 01/04/2012   Past Surgical History  Procedure Laterality Date  . Tonsillectomy    . Wisdom tooth extraction     Family History  Problem Relation Age of Onset  . Diabetes Maternal Grandmother   . Heart disease Maternal Grandmother   . Hypertension Maternal Grandmother   . Cancer Maternal Grandmother     skin  . Heart disease Maternal Grandfather    History  Substance Use Topics  . Smoking status: Current Every Day Smoker -- 0.50 packs/day for 7 years    Types: Cigarettes  . Smokeless tobacco: Never Used  . Alcohol Use: No    OB History   Grav Para Term Preterm Abortions TAB SAB Ect Mult Living   3 2 2  0 1 1  0 0 2     Review of Systems 10 Systems reviewed and are negative for acute change except as noted in the HPI. Allergies  Review of patient's allergies indicates no known allergies.  Home Medications   Current Outpatient Rx  Name  Route  Sig  Dispense  Refill  . acetaminophen (TYLENOL) 500 MG tablet   Oral   Take 500 mg by mouth every 6 (six) hours as needed for pain.         Marland Kitchen escitalopram (LEXAPRO) 10 MG tablet   Oral   Take 10 mg by mouth daily.         Marland Kitchen ibuprofen (ADVIL,MOTRIN) 200 MG tablet   Oral   Take 200 mg by mouth every 6 (six) hours as needed for pain.          BP 115/76  Pulse 85  Temp(Src) 98.4 F (36.9 C) (Oral)  Resp 20  SpO2 100%  LMP 02/06/2013  Breastfeeding? No Physical Exam  Nursing note and vitals reviewed. Constitutional:  Awake, alert, nontoxic appearance.  HENT:  Head: Atraumatic.  Eyes: Right eye exhibits no discharge. Left eye exhibits no discharge.  Neck: Neck supple.  Cardiovascular: Regular rhythm.   No  murmur heard. Mildly tachycardic with a pulse rate 100  Pulmonary/Chest: Effort normal. No respiratory distress. She has no wheezes. She has no rales. She exhibits tenderness.  Lungs clear to auscultation and unlabored however patient has an abnormal breathing pattern in that she pauses once or twice fleetingly with every inspiration before exhaling; reproducible chest wall tenderness left sternal border with no rash noted  Abdominal: Soft. Bowel sounds are normal. She exhibits no distension. There is no tenderness. There is no rebound and no guarding.  Musculoskeletal: She exhibits no edema and no tenderness.  Baseline ROM, no obvious new focal weakness.  Neurological: She is alert.  Mental status and motor strength appears baseline for patient and situation.  Skin: No rash noted.  Psychiatric:  Appears anxious    ED Course  Procedures  (including critical care time) ECG: Sinus tachycardia, ventricular rate 106, normal axis, normal intervals, no acute ischemic changes noted, no comparison ECG immediately available  Patient / Family / Caregiver understand and agree with initial ED impression and plan with expectations set for ED visit.Pt feels improved after observation and/or treatment in ED.Patient / Family / Caregiver informed of clinical course, understand medical decision-making process, and agree with plan.  Labs Review Labs Reviewed  POCT I-STAT, CHEM 8 - Abnormal; Notable for the following:    Potassium 3.1 (*)    Calcium, Ion 1.11 (*)    All other components within normal limits  D-DIMER, QUANTITATIVE  POCT PREGNANCY, URINE  POCT I-STAT TROPONIN I   Imaging Review Dg Chest 2 View  03/05/2013   CLINICAL DATA:  Shortness of breath  EXAM: CHEST  2 VIEW  COMPARISON:  November 21, 2011  FINDINGS: The lungs are clear. The heart size and pulmonary vascularity are normal. No adenopathy. No bone lesions.  IMPRESSION: No abnormality noted.   Electronically Signed   By: Bretta Bang   On: 03/05/2013 20:44    MDM   1. Dyspnea    I doubt any other EMC precluding discharge at this time including, but not necessarily limited to the following:ACS, PE.    Hurman Horn, MD 03/06/13 1310

## 2014-01-10 IMAGING — CR DG CHEST 2V
2 series · 2 of 2 positions shown · non-contrast
Comparison: November 21, 2011

CLINICAL DATA: Shortness of breath

EXAM:
CHEST  2 VIEW

[w chest pa]
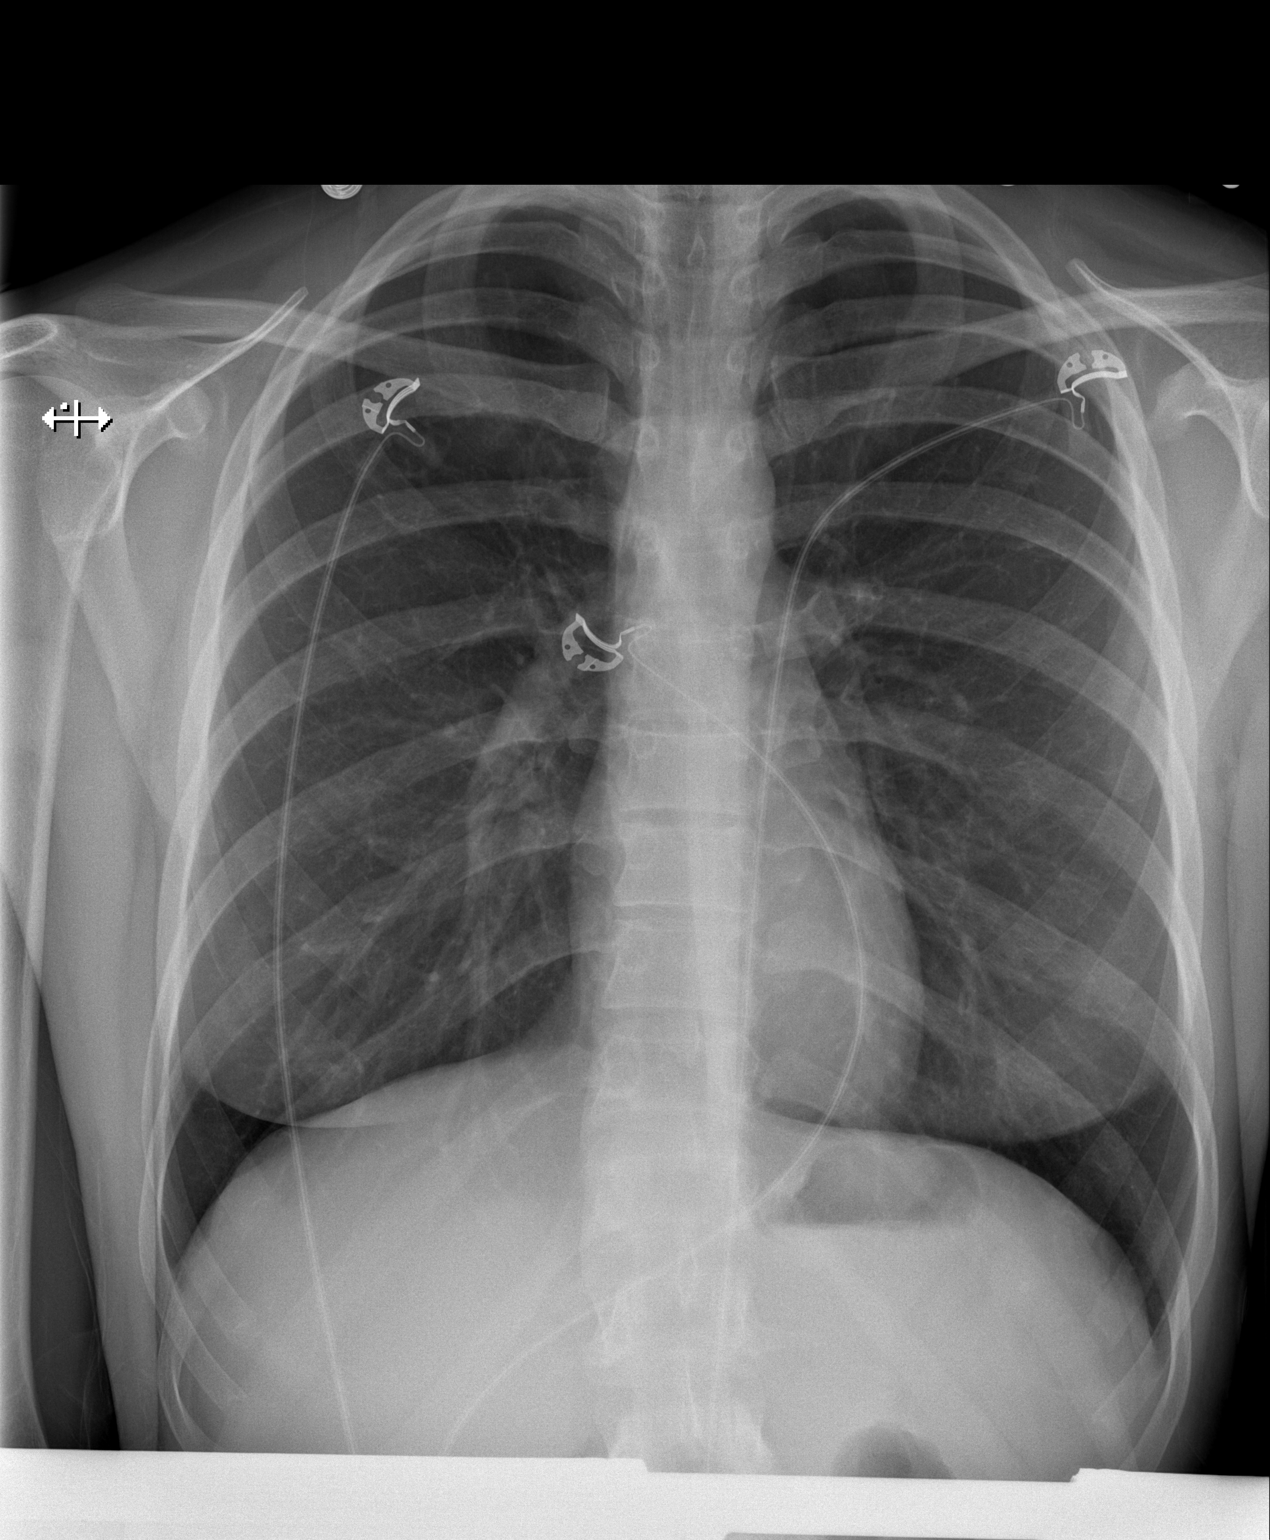

[w chest lat]
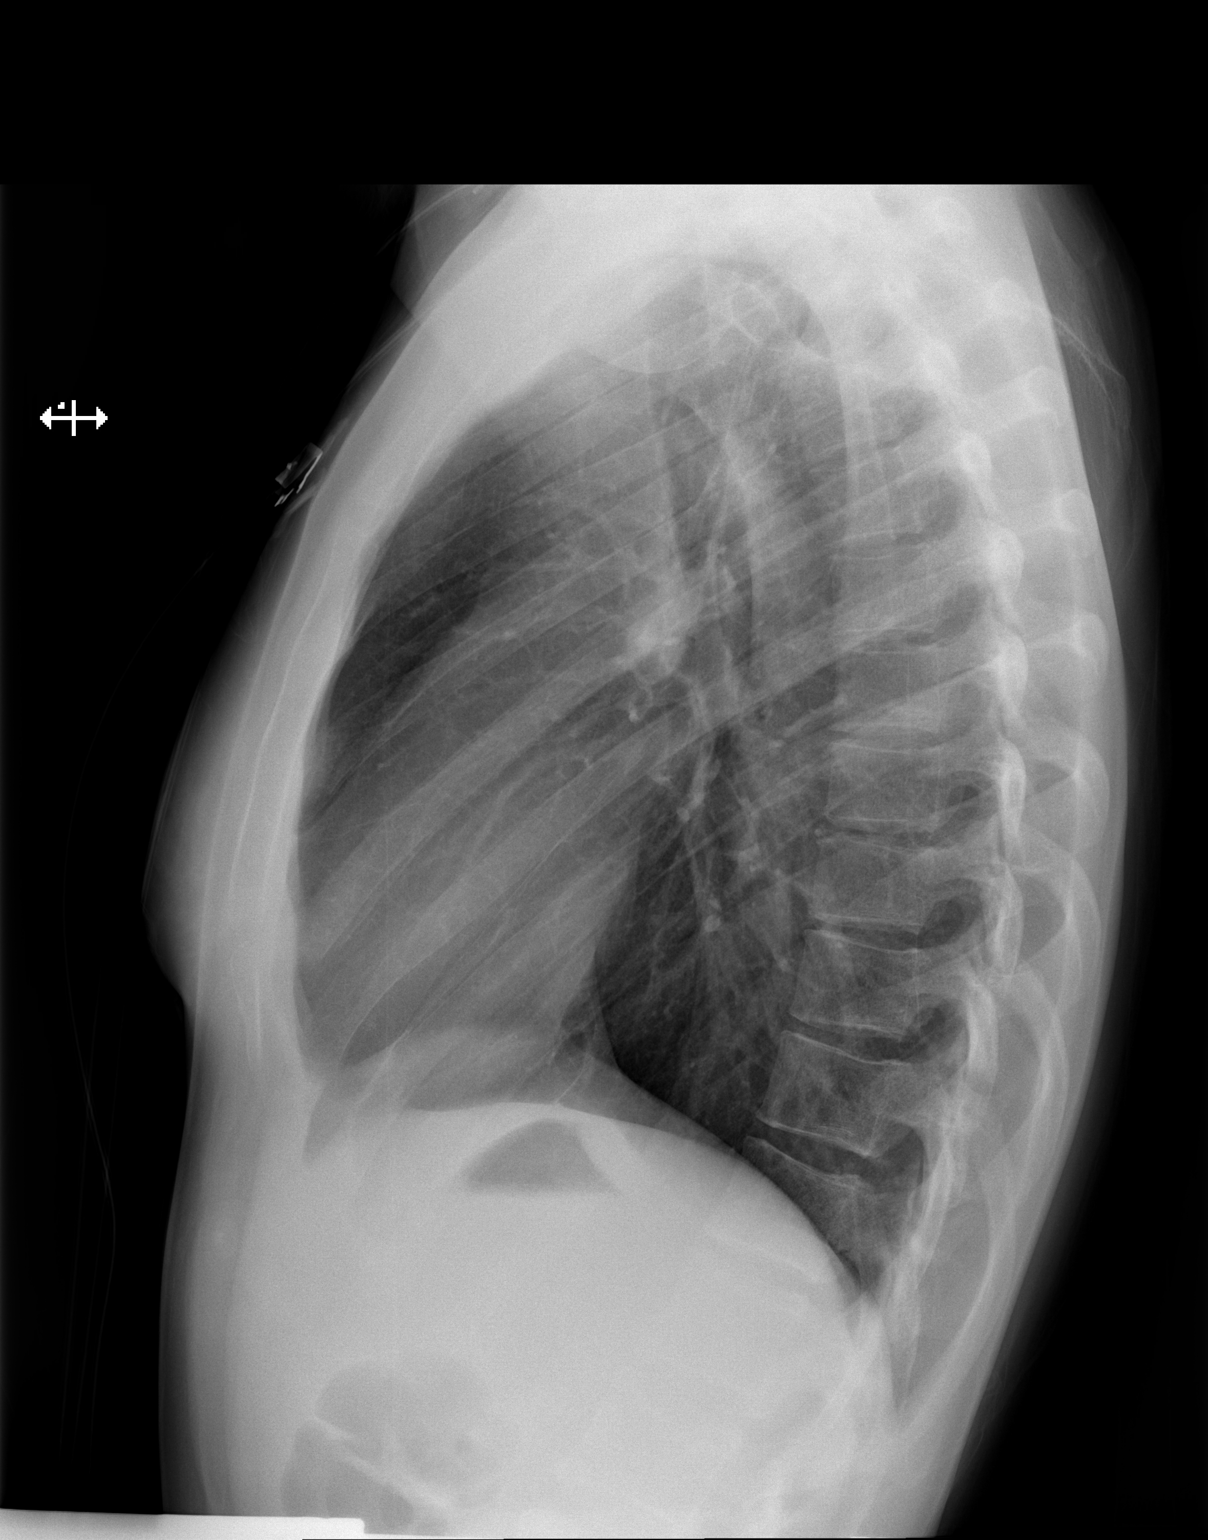

[2 of 2 positions shown; findings below may reference images not displayed]

FINDINGS: The lungs are clear. The heart size and pulmonary vascularity are
normal. No adenopathy. No bone lesions.
IMPRESSION: No abnormality noted.

## 2014-04-18 ENCOUNTER — Encounter (HOSPITAL_COMMUNITY): Payer: Self-pay | Admitting: Emergency Medicine

## 2014-05-01 ENCOUNTER — Ambulatory Visit (INDEPENDENT_AMBULATORY_CARE_PROVIDER_SITE_OTHER): Payer: BC Managed Care – PPO | Admitting: Physician Assistant

## 2014-05-01 VITALS — BP 126/78 | HR 118 | Temp 98.1°F | Resp 20 | Ht 64.75 in | Wt 122.4 lb

## 2014-05-01 DIAGNOSIS — R509 Fever, unspecified: Secondary | ICD-10-CM

## 2014-05-01 DIAGNOSIS — M791 Myalgia, unspecified site: Secondary | ICD-10-CM

## 2014-05-01 DIAGNOSIS — R0981 Nasal congestion: Secondary | ICD-10-CM

## 2014-05-01 LAB — POCT INFLUENZA A/B
INFLUENZA A, POC: NEGATIVE
Influenza B, POC: NEGATIVE

## 2014-05-01 MED ORDER — IPRATROPIUM BROMIDE 0.03 % NA SOLN: 2.0000 | Freq: Two times a day (BID) | NASAL | Status: AC

## 2014-05-01 MED ORDER — GUAIFENESIN ER 1200 MG PO TB12: 1.0000 | ORAL_TABLET | Freq: Two times a day (BID) | ORAL | Status: AC | PRN

## 2014-05-01 MED ORDER — AMOXICILLIN-POT CLAVULANATE 875-125 MG PO TABS: 1.0000 | ORAL_TABLET | Freq: Two times a day (BID) | ORAL | Status: AC

## 2014-05-01 NOTE — Patient Instructions (Signed)
Get plenty of rest and drink at least 64 ounces of water daily. 

## 2014-05-01 NOTE — Progress Notes (Signed)
Subjective:    Patient ID: Lauren [name], female    DOB: Jan 27, 1986, 28 y.o.   MRN: [id number]   PCP: Oliver PilaICHARDSON,KATHY W, MD  Chief Complaint  Patient presents with  . Sinusitis    body aches, fever, nasal congestion  x 3 days.     No Known Allergies  Patient Active Problem List   Diagnosis Date Noted  . VASOVAGAL SYNCOPE 02/25/2007  . SEIZURE DISORDER 02/25/2007  . CHICKENPOX, HX OF 02/25/2007    Prior to Admission medications   Medication Sig Start Date End Date Taking? Authorizing Provider  acetaminophen (TYLENOL) 500 MG tablet Take 500 mg by mouth every 6 (six) hours as needed for pain.    Historical Provider, MD  ibuprofen (ADVIL,MOTRIN) 200 MG tablet Take 200 mg by mouth every 6 (six) hours as needed for pain.    Historical Provider, MD    Medical, Surgical, Family and Social History reviewed and updated.  HPI  This 28 y.o. female presents for evaluation of fever, body aches, nasal congestion x 3 days.  Feels like previous sinusitis ("That's what I always get sick with") except usually doesn't have body aches. Has not had a flu vaccine this season. No nausea, vomiting or diarrhea. No SOB or CP. Feels a little woozy, but not dizzy. No rash. No known sick contacts. She is a smoker-about 1/2 ppd for 7 years.  She is accompanied today by her husband, [name].   Review of Systems As above.    Objective:   Physical Exam  Constitutional: She is oriented to person, place, and time. Vital signs are normal. She appears well-developed and well-nourished. No distress.  BP 126/78 mmHg  Pulse 118  Temp(Src) 98.1 F (36.7 C) (Oral)  Resp 20  Ht 5' 4.75" (1.645 m)  Wt 122 lb 6.4 oz (55.52 kg)  BMI 20.52 kg/m2  SpO2 96%  LMP 04/11/2014   HENT:  Head: Normocephalic and atraumatic.  Right Ear: Hearing, tympanic membrane, external ear and ear canal normal.  Left Ear: Hearing, tympanic membrane, external ear and ear canal normal.  Nose: Mucosal edema and rhinorrhea  present.  No foreign bodies. Right sinus exhibits no maxillary sinus tenderness and no frontal sinus tenderness. Left sinus exhibits no maxillary sinus tenderness and no frontal sinus tenderness.  Mouth/Throat: Uvula is midline, oropharynx is clear and moist and mucous membranes are normal. No uvula swelling. No oropharyngeal exudate.  Eyes: Conjunctivae and EOM are normal. Pupils are equal, round, and reactive to light. Right eye exhibits no discharge. Left eye exhibits no discharge. No scleral icterus.  Neck: Trachea normal, normal range of motion and full passive range of motion without pain. Neck supple. No thyroid mass and no thyromegaly present.  Cardiovascular: Regular rhythm and normal heart sounds.  Tachycardia present.   Pulmonary/Chest: Effort normal and breath sounds normal.  Lymphadenopathy:       Head (right side): No submandibular, no tonsillar, no preauricular, no posterior auricular and no occipital adenopathy present.       Head (left side): No submandibular, no tonsillar, no preauricular and no occipital adenopathy present.    She has no cervical adenopathy.       Right: No supraclavicular adenopathy present.       Left: No supraclavicular adenopathy present.  Neurological: She is alert and oriented to person, place, and time. She has normal strength. No cranial nerve deficit or sensory deficit.  Skin: Skin is warm, dry and intact. No rash noted.  Psychiatric: She has  a normal mood and affect. Her speech is normal and behavior is normal.      Results for orders placed or performed in visit on 05/01/14  POCT Influenza A/B  Result Value Ref Range   Influenza A, POC Negative    Influenza B, POC Negative        Assessment & Plan:  1. Nasal congestion 2. Myalgia 3. Fever, unspecified fever cause Smoking history increases the risk of bacterial infection, so elect to cover, though suspect that the illness may be viral. Anticipatory guidance provided. Supportive care. -  POCT Influenza A/B - amoxicillin-clavulanate (AUGMENTIN) 875-125 MG per tablet; Take 1 tablet by mouth 2 (two) times daily.  Dispense: 20 tablet; Refill: 0 - ipratropium (ATROVENT) 0.03 % nasal spray; Place 2 sprays into both nostrils 2 (two) times daily.  Dispense: 30 mL; Refill: 0 - Guaifenesin (MUCINEX MAXIMUM STRENGTH) 1200 MG TB12; Take 1 tablet (1,200 mg total) by mouth every 12 (twelve) hours as needed.  Dispense: 14 tablet; Refill: 1    Fernande Brashelle S. Minal Stuller, PA-C Physician Assistant-Certified [facility]

## 2014-10-19 ENCOUNTER — Encounter: Payer: Self-pay | Admitting: *Deleted

## 2014-10-19 ENCOUNTER — Ambulatory Visit (INDEPENDENT_AMBULATORY_CARE_PROVIDER_SITE_OTHER): Payer: BLUE CROSS/BLUE SHIELD | Admitting: Neurology

## 2014-10-19 ENCOUNTER — Encounter: Payer: Self-pay | Admitting: Neurology

## 2014-10-19 VITALS — BP 125/83 | HR 110 | Temp 97.4°F | Ht 65.0 in | Wt 124.6 lb

## 2014-10-19 DIAGNOSIS — I4581 Long QT syndrome: Secondary | ICD-10-CM | POA: Diagnosis not present

## 2014-10-19 DIAGNOSIS — Z9114 Patient's other noncompliance with medication regimen: Secondary | ICD-10-CM | POA: Diagnosis not present

## 2014-10-19 DIAGNOSIS — H9312 Tinnitus, left ear: Secondary | ICD-10-CM

## 2014-10-19 DIAGNOSIS — R519 Headache, unspecified: Secondary | ICD-10-CM

## 2014-10-19 DIAGNOSIS — R51 Headache with orthostatic component, not elsewhere classified: Secondary | ICD-10-CM

## 2014-10-19 DIAGNOSIS — R5382 Chronic fatigue, unspecified: Secondary | ICD-10-CM | POA: Diagnosis not present

## 2014-10-19 DIAGNOSIS — H93A2 Pulsatile tinnitus, left ear: Secondary | ICD-10-CM

## 2014-10-19 DIAGNOSIS — H539 Unspecified visual disturbance: Secondary | ICD-10-CM | POA: Diagnosis not present

## 2014-10-19 DIAGNOSIS — G930 Cerebral cysts: Secondary | ICD-10-CM

## 2014-10-19 DIAGNOSIS — R208 Other disturbances of skin sensation: Secondary | ICD-10-CM | POA: Diagnosis not present

## 2014-10-19 DIAGNOSIS — R200 Anesthesia of skin: Secondary | ICD-10-CM | POA: Insufficient documentation

## 2014-10-19 MED ORDER — NORTRIPTYLINE HCL 10 MG PO CAPS: 20.0000 mg | ORAL_CAPSULE | Freq: Every day | ORAL | Status: DC

## 2014-10-19 MED ORDER — METHYLPREDNISOLONE 4 MG PO TBPK: ORAL_TABLET | ORAL | Status: AC

## 2014-10-19 NOTE — Patient Instructions (Signed)
Overall you are doing fairly well but I do want to suggest a few things today:   Remember to drink plenty of fluid, eat healthy meals and do not skip any meals. Try to eat protein with a every meal and eat a healthy snack such as fruit or nuts in between meals. Try to keep a regular sleep-wake schedule and try to exercise daily, particularly in the form of walking, 20-30 minutes a day, if you can.   As far as your medications are concerned, I would like to suggest:  nortriptylene 10mg  at night and can increase to 20mg  in one week Prenisone taper (medrol dose pak)  Stop goody powder, rebound headaches and serious risk of bleeding nad death.   As far as diagnostic testing: MRI brain and an MRA of the head, labwork, EKG. Headache diary.   I would like to see you back in 3 months, sooner if we need to. Please call us with any interim questions, concerns, problems, updates or refill requests.   Please also call us for any test results so we can go over those with you on the phone.  My clinical assistant and will answer any of your questions and relay your messages to me and also relay most of my messages to you.   Our phone number is [phone]. We also have an after hours call service for urgent matters and there is a physician on-call for urgent questions. For any emergencies you know to call 911 or go to the nearest emergency room

## 2014-10-19 NOTE — Progress Notes (Addendum)
[facility]    Provider:  Dr Lucia GaskinsAhern Referring Provider: Deatra JamesSun, Vyvyan, MD Primary Care Physician:  Leanor RubensteinSUN,VYVYAN Y, MD  CC:  Chronic headaches  HPI:  Cathey EndowCasey M Apsey is a 29 y.o. female here as a referral from Dr. Wynelle LinkSun for chronic headaches. Headaches since a teenager. Pressure, throbbing, pulsating. Worse when standing, so she tries to stand slower. Always in the left. No light sensitivity, no sounds sensitivity. No nausea, no vomiting. Mother with cluster headaches. Every day, wakes up with headaches. Can get to a 10/10. Who head can burn when she stands and radiates to the base of the neck. No known triggers. Laying down helps a little. Can have headache up to 24 hours but on average 8 hours a day. Has a history of epilepsy. 8-9 years ago she had 2 seizures, has a history of grand mal seizures at 89. No aura. Concentrating makes it worse. No trauma. At least 4 goodys a day. Worsening. Pulsating tinnitus. wealkness and numbness in the left arm. +fatigue. Positional headaches. Seizure history. MRi in the past with unspecified cysts found.    Review of Systems: Patient complains of symptoms per HPI as well as the following symptoms: The loss, fatigue, blurred vision, easy bruising, shortness of breath, feeling cold, constipation, allergies, headache, dizziness, decreased energy. Pertinent negatives per HPI. All others negative.   History   Social History  . Marital Status: Married    Spouse Name: [name]  . Number of Children: 2  . Years of Education: 13   Occupational History  . warehouse associate    Social History Main Topics  . Smoking status: Current Every Day Smoker -- 0.50 packs/day for 7 years    Types: Cigarettes  . Smokeless tobacco: Never Used  . Alcohol Use: 0.0 oz/week    0 Standard drinks or equivalent per week     Comment: Rarely  . Drug Use: No  . Sexual Activity:    Partners: Female    CopyBirth Control/ Protection: None   Other Topics Concern  . Not on  file   Social History Narrative   Lives with her husband, their two children, his child from a previous relationship, and her mother.   Caffeine use: Drinks 2 red bulls per day and drinks 4 cups coke per day       Family History  Problem Relation Age of Onset  . Diabetes Maternal Grandmother   . Heart disease Maternal Grandmother   . Hypertension Maternal Grandmother   . Cancer Maternal Grandmother     skin  . Stroke Maternal Grandmother   . Heart disease Maternal Grandfather   . Hyperlipidemia Father   . Migraines Mother     Past Medical History  Diagnosis Date  . Headache(784.0)     migraines in middle school  . Seizures     from age [age], last with migraine  . 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 Psoriasis     age 89  . SVD (spontaneous vaginal delivery) 01/04/2012  . Allergy   . Blood transfusion without reported diagnosis     Past Surgical History  Procedure Laterality Date  . Tonsillectomy  2006  . Wisdom tooth extraction  2007  . Knee surgery Left 2006    Orthoscopic     Current Outpatient Prescriptions  Medication Sig Dispense Refill  . Aspirin-Acetaminophen-Caffeine (GOODY HEADACHE PO) Take by mouth.    Marland Kitchen. ipratropium (ATROVENT) 0.03 % nasal spray Place 2 sprays  into both nostrils 2 (two) times daily. 30 mL 0  . acetaminophen (TYLENOL) 500 MG tablet Take 500 mg by mouth every 6 (six) hours as needed for pain.    . Guaifenesin (MUCINEX MAXIMUM STRENGTH) 1200 MG TB12 Take 1 tablet (1,200 mg total) by mouth every 12 (twelve) hours as needed. (Patient not taking: Reported on 10/19/2014) 14 tablet 1  . ibuprofen (ADVIL,MOTRIN) 200 MG tablet Take 200 mg by mouth every 6 (six) hours as needed for pain.    . methylPREDNISolone (MEDROL DOSEPAK) 4 MG TBPK tablet follow package directions 21 tablet 0  . nortriptyline (PAMELOR) 10 MG capsule Take 2 capsules (20 mg total) by mouth at bedtime. 60 capsule 6   No current facility-administered medications for this visit.     Allergies as of 10/19/2014  . (No Known Allergies)    Vitals: BP 125/83 mmHg  Pulse 110  Temp(Src) 97.4 F (36.3 C)  Ht  (1.651 m)  Wt 124 lb 9.6 oz (56.518 kg)  BMI 20.73 kg/m2 Last Weight:  Wt Readings from Last 1 Encounters:  10/19/14 124 lb 9.6 oz (56.518 kg)   Last Height:   Ht Readings from Last 1 Encounters:  10/19/14  (1.651 m)    Physical exam: Exam: Gen: NAD, conversant, well nourised, well groomed                     CV: RRR, no MRG. No Carotid Bruits. No peripheral edema, warm, nontender Eyes: Conjunctivae clear without exudates or hemorrhage  Neuro: Detailed Neurologic Exam  Speech:    Speech is normal; fluent and spontaneous with normal comprehension.  Cognition:    The patient is oriented to person, place, and time;     recent and remote memory intact;     language fluent;     normal attention, concentration,     fund of knowledge Cranial Nerves:    The pupils are equal, round, and reactive to light. The fundi are normal and spontaneous venous pulsations are present. Visual fields are full to finger confrontation. Extraocular movements are intact. Trigeminal sensation is intact and the muscles of mastication are normal. The face is symmetric. The palate elevates in the midline. Hearing intact. Voice is normal. Shoulder shrug is normal. The tongue has normal motion without fasciculations.   Coordination:    Normal finger to nose and heel to shin. Normal rapid alternating movements.   Gait:    Heel-toe and tandem gait are normal.   Motor Observation:    No asymmetry, no atrophy, and no involuntary movements noted. Tone:    Normal muscle tone.    Posture:    Posture is normal. normal erect    Strength: Left arm proximal weakness.     Otherwise strength is V/V in the upper and lower limbs.      Sensation: intact to LT     Reflex Exam:  DTR's:    Deep tendon reflexes in the upper and lower extremities are normal bilaterally.    Toes:    The toes are downgoing bilaterally.   Clonus:    Clonus is absent.      Assessment/Plan:  29 year old female with chronic daily headaches which are worsening. Severe, positional, daily. She has a history of imaging with an unknown cyst in the past. She also has a history of seizures. She reports pulsatile tinnitus weakness and numbness.  We'll order MRI of the brain with and without contrast given worsening, severe headaches that  are positional and previous imaging with some sort of cyst, also with left arm weakness and numbness. Will also order MRA of the brain given symptoms that include pulsatile tinnitus.  Will start patient on nortriptyline at night. Will also give her a steroid taper.  We'll order a TSH, CMP, CBC given headaches and fatigue.   Naomie DeanAntonia Maevyn Riordan, MD  [facility] [address] [place] [id number]  Phone [phone] Fax [phone]

## 2014-10-20 LAB — COMPREHENSIVE METABOLIC PANEL
ALBUMIN: 4.9 g/dL (ref 3.5–5.5)
ALK PHOS: 43 IU/L (ref 39–117)
ALT: 10 IU/L (ref 0–32)
AST: 13 IU/L (ref 0–40)
Albumin/Globulin Ratio: 2 (ref 1.1–2.5)
BUN/Creatinine Ratio: 16 (ref 8–20)
BUN: 13 mg/dL (ref 6–20)
Bilirubin Total: <0.2 mg/dL (ref 0.0–1.2)
CO2: 20 mmol/L (ref 18–29)
CREATININE: 0.82 mg/dL (ref 0.57–1.00)
Calcium: 9.7 mg/dL (ref 8.7–10.2)
Chloride: 106 mmol/L (ref 97–108)
GFR calc Af Amer: 114 mL/min/{1.73_m2} (ref >59)
GFR, EST NON AFRICAN AMERICAN: 99 mL/min/{1.73_m2} (ref >59)
GLUCOSE: 84 mg/dL (ref 65–99)
Globulin, Total: 2.5 g/dL (ref 1.5–4.5)
Potassium: 5.2 mmol/L (ref 3.5–5.2)
SODIUM: 142 mmol/L (ref 134–144)
Total Protein: 7.4 g/dL (ref 6.0–8.5)

## 2014-10-20 LAB — CBC
HEMATOCRIT: 36.2 % (ref 34.0–46.6)
Hemoglobin: 12.6 g/dL (ref 11.1–15.9)
MCH: 29 pg (ref 26.6–33.0)
MCHC: 34.8 g/dL (ref 31.5–35.7)
MCV: 83 fL (ref 79–97)
Platelets: 158 10*3/uL (ref 150–379)
RBC: 4.35 x10E6/uL (ref 3.77–5.28)
RDW: 12.8 % (ref 12.3–15.4)
WBC: 7 10*3/uL (ref 3.4–10.8)

## 2014-10-20 LAB — THYROID PANEL WITH TSH
FREE THYROXINE INDEX: 2 (ref 1.2–4.9)
T3 Uptake Ratio: 30 % (ref 24–39)
T4, Total: 6.5 ug/dL (ref 4.5–12.0)
TSH: 1.81 u[IU]/mL (ref 0.450–4.500)

## 2014-10-21 ENCOUNTER — Encounter: Payer: Self-pay | Admitting: Neurology

## 2014-10-21 ENCOUNTER — Telehealth: Payer: Self-pay

## 2014-10-21 NOTE — Telephone Encounter (Signed)
Pt was informed that labs were normal

## 2014-10-26 ENCOUNTER — Telehealth: Payer: Self-pay | Admitting: Neurology

## 2014-10-26 ENCOUNTER — Ambulatory Visit (INDEPENDENT_AMBULATORY_CARE_PROVIDER_SITE_OTHER): Payer: BLUE CROSS/BLUE SHIELD

## 2014-10-26 DIAGNOSIS — H539 Unspecified visual disturbance: Secondary | ICD-10-CM | POA: Diagnosis not present

## 2014-10-26 DIAGNOSIS — H9312 Tinnitus, left ear: Secondary | ICD-10-CM | POA: Diagnosis not present

## 2014-10-26 DIAGNOSIS — R51 Headache with orthostatic component, not elsewhere classified: Secondary | ICD-10-CM

## 2014-10-26 DIAGNOSIS — H93A2 Pulsatile tinnitus, left ear: Secondary | ICD-10-CM

## 2014-10-26 DIAGNOSIS — R208 Other disturbances of skin sensation: Secondary | ICD-10-CM | POA: Diagnosis not present

## 2014-10-26 DIAGNOSIS — R519 Headache, unspecified: Secondary | ICD-10-CM

## 2014-10-26 DIAGNOSIS — G930 Cerebral cysts: Secondary | ICD-10-CM | POA: Diagnosis not present

## 2014-10-26 DIAGNOSIS — R200 Anesthesia of skin: Secondary | ICD-10-CM

## 2014-10-26 MED ORDER — GADOPENTETATE DIMEGLUMINE 469.01 MG/ML IV SOLN: 11.0000 mL | Freq: Once | INTRAVENOUS | Status: AC | PRN

## 2014-10-26 NOTE — Telephone Encounter (Signed)
Patient called wanting to speak with a nurse regarding her experiencing high blood pressure. Patient checked her high blood pressure today and it was 141-95. Please call and advise. Patient [phone]

## 2014-10-26 NOTE — Telephone Encounter (Signed)
Spoke with patient about high BP. She stated she already finished the medrol dosepak Dr. Lucia GaskinsAhern prescriped and is still taking nortriptyline. She stated her BP was 141/95 and pulse was 146 at a resting state, sitting. I advised that she contact her PCP and she stated she already had and was waiting for a call back. I told her to recheck her BP and pulse and if they continued to be high to go to the emergency room. She denied chest pain, shortness of breath. She stated she will contact us with any more questions.

## 2014-10-27 ENCOUNTER — Encounter: Payer: Self-pay | Admitting: *Deleted

## 2014-10-27 ENCOUNTER — Ambulatory Visit (INDEPENDENT_AMBULATORY_CARE_PROVIDER_SITE_OTHER): Payer: BLUE CROSS/BLUE SHIELD | Admitting: *Deleted

## 2014-10-27 ENCOUNTER — Encounter: Payer: Self-pay | Admitting: Internal Medicine

## 2014-10-27 ENCOUNTER — Telehealth: Payer: Self-pay | Admitting: *Deleted

## 2014-10-27 VITALS — BP 126/85 | HR 128

## 2014-10-27 DIAGNOSIS — I499 Cardiac arrhythmia, unspecified: Secondary | ICD-10-CM | POA: Diagnosis not present

## 2014-10-27 HISTORY — DX: Cardiac arrhythmia, unspecified: I49.9

## 2014-10-27 NOTE — Telephone Encounter (Signed)
Spoke with Larita FifeLynn from [facility] and she stated orders cannot be put in to their office because they first need to have an appointment to be seen and then have an EKG. They are not a walk in clinic. She stated she is going to talk to her manager to see what they need to do and call back if there is something I can do to help. Dr. Lucia GaskinsAhern aware and we are going to talk with our manager to clarify where we can send patients to have EKG's done in the future. Larita FifeLynn verbalized understanding.

## 2014-10-27 NOTE — Progress Notes (Signed)
Patient sent over by Dr. Lucia GaskinsAhern, [facility], for EKG because patient had been started on Nortriptyline.  Patient has never been seen in this practice. She presents with recent episodes of hypertension hx and tachycardia hx and chest discomfort/pressure x2 month hx. BP 126/85, HR 128. Patient states she has also experienced intermittent radiating Left arm pain over couple of months. Mild dizziness at times. Currently, patient states her HR is "racing" and has moderate chest pressure intermittently and increased fatigue whenever heart is "racing". EKG performed. Reviewed by Dr. Ladona Ridgelaylor (Sinus Tachycardia 105 and 115 HR). Patient admits to drinking "alot of soda and at least two cans of Red Bull a day". Patient also going through significant stress levels at this time per patient report. Patient advised by Dr. Ladona Ridgelaylor to follow up with PCP and states EKG not acute". Patient advised to go to ED should she experiences elevated BP, SOB, dyspnea, increased dizziness, worsening pain, numbness to left arm again, HA or other new sx. Patient verbalized understanding and agreement. Patient stated she will follow up with PCP and Dr. Lucia GaskinsAhern.

## 2014-10-27 NOTE — Telephone Encounter (Signed)
Spoke with patient to see how she was feeling and follow up with her about EKG and where to go for that. Pt stated she talked with her PCP and they told her to monitor her BP and pulse and go to the ER if she experienced any worsening symptoms. She denied chest pain and shortness of breath at this time and I told her to go to the ER if she experiences these symptoms. I also explained that she should call [facility] to ask about her EKG that Dr. Lucia GaskinsAhern ordered. I gave her the number : [phone]. Pt verbalized understanding.

## 2014-10-27 NOTE — Telephone Encounter (Signed)
See previous phone note.  

## 2014-10-31 ENCOUNTER — Telehealth: Payer: Self-pay | Admitting: Neurology

## 2014-10-31 NOTE — Telephone Encounter (Signed)
Called patient regarding imaging of the brain. She has an arachnoid cyst that is not compromising her brain in any way. MRA of the head was unremarkable. She can call back if she has questions.   About the EKG, will discuss with office manager.  Thanks.

## 2014-11-01 ENCOUNTER — Other Ambulatory Visit: Payer: Self-pay | Admitting: Neurology

## 2014-11-01 MED ORDER — NORTRIPTYLINE HCL 10 MG PO CAPS: 30.0000 mg | ORAL_CAPSULE | Freq: Every day | ORAL | Status: AC

## 2014-11-01 NOTE — Telephone Encounter (Signed)
Spoke to patient about MRI, incidental arachnoid cyst. Hee headaches are better, will increase nortriptyline at night to 30mg .

## 2014-11-01 NOTE — Telephone Encounter (Signed)
Patient is calling back and would like for Dr. Lucia GaskinsAhern to give her a call back [phone].  Thanks!

## 2015-01-25 ENCOUNTER — Ambulatory Visit: Payer: BLUE CROSS/BLUE SHIELD | Admitting: Neurology
